# Patient Record
Sex: Male | Born: 1950 | Race: White | Hispanic: Yes | Marital: Single | Smoking: Never smoker
Health system: Southern US, Community
[De-identification: ages and names within clinical notes are randomized; demographics above are authoritative.]

## PROBLEM LIST (undated history)

## (undated) DIAGNOSIS — R7881 Bacteremia: Secondary | ICD-10-CM

## (undated) DIAGNOSIS — A09 Infectious gastroenteritis and colitis, unspecified: Secondary | ICD-10-CM

## (undated) DIAGNOSIS — K219 Gastro-esophageal reflux disease without esophagitis: Secondary | ICD-10-CM

## (undated) DIAGNOSIS — B9561 Methicillin susceptible Staphylococcus aureus infection as the cause of diseases classified elsewhere: Secondary | ICD-10-CM

## (undated) DIAGNOSIS — E119 Type 2 diabetes mellitus without complications: Secondary | ICD-10-CM

## (undated) DIAGNOSIS — K7460 Unspecified cirrhosis of liver: Secondary | ICD-10-CM

## (undated) DIAGNOSIS — I10 Essential (primary) hypertension: Secondary | ICD-10-CM

## (undated) HISTORY — DX: Methicillin susceptible Staphylococcus aureus infection as the cause of diseases classified elsewhere: B95.61

## (undated) HISTORY — PX: EYE SURGERY: SHX253

## (undated) HISTORY — DX: Bacteremia: R78.81

## (undated) HISTORY — PX: OTHER SURGICAL HISTORY: SHX169

## (undated) HISTORY — PX: SKIN GRAFT: SHX250

---

## 2013-09-18 ENCOUNTER — Inpatient Hospital Stay (HOSPITAL_COMMUNITY)
Admission: EM | Admit: 2013-09-18 | Discharge: 2013-09-24 | DRG: 871 | Disposition: A | Discharge status: Home Health | Payer: Medicare Other | Attending: Family Medicine | Admitting: Family Medicine

## 2013-09-18 ENCOUNTER — Emergency Department (HOSPITAL_COMMUNITY): Payer: Medicare Other

## 2013-09-18 ENCOUNTER — Encounter (HOSPITAL_COMMUNITY): Payer: Self-pay | Admitting: Emergency Medicine

## 2013-09-18 DIAGNOSIS — K7290 Hepatic failure, unspecified without coma: Secondary | ICD-10-CM

## 2013-09-18 DIAGNOSIS — A419 Sepsis, unspecified organism: Secondary | ICD-10-CM | POA: Diagnosis present

## 2013-09-18 DIAGNOSIS — K219 Gastro-esophageal reflux disease without esophagitis: Secondary | ICD-10-CM | POA: Diagnosis present

## 2013-09-18 DIAGNOSIS — E669 Obesity, unspecified: Secondary | ICD-10-CM | POA: Diagnosis present

## 2013-09-18 DIAGNOSIS — Z794 Long term (current) use of insulin: Secondary | ICD-10-CM

## 2013-09-18 DIAGNOSIS — R7881 Bacteremia: Secondary | ICD-10-CM

## 2013-09-18 DIAGNOSIS — K7682 Hepatic encephalopathy: Secondary | ICD-10-CM

## 2013-09-18 DIAGNOSIS — R5383 Other fatigue: Secondary | ICD-10-CM | POA: Diagnosis present

## 2013-09-18 DIAGNOSIS — A4101 Sepsis due to Methicillin susceptible Staphylococcus aureus: Principal | ICD-10-CM | POA: Diagnosis present

## 2013-09-18 DIAGNOSIS — F1020 Alcohol dependence, uncomplicated: Secondary | ICD-10-CM | POA: Diagnosis present

## 2013-09-18 DIAGNOSIS — E119 Type 2 diabetes mellitus without complications: Secondary | ICD-10-CM

## 2013-09-18 DIAGNOSIS — D61818 Other pancytopenia: Secondary | ICD-10-CM | POA: Diagnosis present

## 2013-09-18 DIAGNOSIS — B9561 Methicillin susceptible Staphylococcus aureus infection as the cause of diseases classified elsewhere: Secondary | ICD-10-CM | POA: Diagnosis present

## 2013-09-18 DIAGNOSIS — N390 Urinary tract infection, site not specified: Secondary | ICD-10-CM | POA: Diagnosis present

## 2013-09-18 DIAGNOSIS — K7460 Unspecified cirrhosis of liver: Secondary | ICD-10-CM | POA: Diagnosis present

## 2013-09-18 DIAGNOSIS — I8510 Secondary esophageal varices without bleeding: Secondary | ICD-10-CM | POA: Diagnosis present

## 2013-09-18 DIAGNOSIS — K7030 Alcoholic cirrhosis of liver without ascites: Secondary | ICD-10-CM | POA: Diagnosis present

## 2013-09-18 DIAGNOSIS — I10 Essential (primary) hypertension: Secondary | ICD-10-CM | POA: Diagnosis present

## 2013-09-18 DIAGNOSIS — B150 Hepatitis A with hepatic coma: Secondary | ICD-10-CM | POA: Diagnosis present

## 2013-09-18 DIAGNOSIS — I959 Hypotension, unspecified: Secondary | ICD-10-CM | POA: Diagnosis present

## 2013-09-18 DIAGNOSIS — K766 Portal hypertension: Secondary | ICD-10-CM | POA: Diagnosis present

## 2013-09-18 HISTORY — DX: Type 2 diabetes mellitus without complications: E11.9

## 2013-09-18 HISTORY — DX: Gastro-esophageal reflux disease without esophagitis: K21.9

## 2013-09-18 HISTORY — DX: Infectious gastroenteritis and colitis, unspecified: A09

## 2013-09-18 HISTORY — DX: Essential (primary) hypertension: I10

## 2013-09-18 LAB — GLUCOSE, CAPILLARY: Glucose-Capillary: 151 mg/dL — ABNORMAL HIGH (ref 70–99)

## 2013-09-18 MED ORDER — ACETAMINOPHEN 650 MG RE SUPP
RECTAL | Status: AC
  Filled 2013-09-18: qty 1

## 2013-09-18 MED ORDER — ACETAMINOPHEN 650 MG RE SUPP
650.0000 mg | Freq: Once | RECTAL | Status: AC
  Administered 2013-09-18: 650 mg via RECTAL

## 2013-09-18 NOTE — ED Notes (Signed)
Nausea, vomiting, altered LOC, fever, high blood sugar, and high blood pressure per EMS. Started around 1900 tonight per EMS.

## 2013-09-18 NOTE — ED Notes (Signed)
Pt vomiting yellow bile. Pt unable to tell what is wrong.

## 2013-09-18 NOTE — ED Provider Notes (Addendum)
CSN: [id number]     Arrival date & time 09/18/13  2302 History   First MD Initiated Contact with Patient 09/18/13 2337     This chart was scribed for Vida Roller, MD by Manuela Schwartz, ED scribe. This patient was seen in room APA08/APA08 and the patient's care was started at 2337.  Chief Complaint  Patient presents with  . Altered Mental Status   Level 5 Caveat to pt's AMS  The history is provided by the patient. No language interpreter was used.   HPI Comments: Kinston Magnan is a 62 y.o. male brought in by ambulance from home, who presents to the Emergency Department for AMS. EMS reports of nausea, emesis episodes, high blood sugar (159) and elevated blood pressure. Reports that his sx began 3 hours PTA.   Meds with him brought by EMS: propanalol and xifaxan   Past Medical History  Diagnosis Date  . Diabetes mellitus without complication   . Hypertension   . GERD (gastroesophageal reflux disease)   . Diarrhea, travelers'    History reviewed. No pertinent past surgical history. No family history on file. History  Substance Use Topics  . Smoking status: Not on file  . Smokeless tobacco: Not on file  . Alcohol Use: No    Review of Systems  Unable to perform ROS  A complete 10 system review of systems was obtained and all systems are negative except as noted in the HPI and PMH.   Allergies  Review of patient's allergies indicates no known allergies.  Home Medications   Current Outpatient Rx  Name  Route  Sig  Dispense  Refill  . insulin NPH-regular (NOVOLIN 70/30) (70-30) 100 UNIT/ML injection   Subcutaneous   Inject 75 Units into the skin daily with breakfast.         . insulin NPH-regular (NOVOLIN 70/30) (70-30) 100 UNIT/ML injection   Subcutaneous   Inject 40 Units into the skin daily with supper.         . losartan (COZAAR) 100 MG tablet   Oral   Take 100 mg by mouth daily.         Marland Kitchen omeprazole (PRILOSEC) 20 MG capsule   Oral   Take 20 mg by mouth  daily.         . propranolol (INDERAL) 20 MG tablet   Oral   Take 20 mg by mouth daily.         . rifaximin (XIFAXAN) 550 MG TABS tablet   Oral   Take 550 mg by mouth 2 (two) times daily.         . tamsulosin (FLOMAX) 0.4 MG CAPS capsule   Oral   Take 0.4 mg by mouth daily.         Marland Kitchen zinc gluconate 50 MG tablet   Oral   Take 50 mg by mouth daily.          Triage Vitals: BP 146/59  Pulse 90  Temp(Src) 102.5 F (39.2 C) (Oral)  Resp 20  SpO2 95% Physical Exam  Nursing note and vitals reviewed. Constitutional: He is oriented to person, place, and time. He appears well-developed and well-nourished. No distress.  Obese  HENT:  Head: Normocephalic and atraumatic.  Eyes: Conjunctivae are normal. Right eye exhibits no discharge. Left eye exhibits no discharge.  Neck: Normal range of motion.  Cardiovascular: Regular rhythm and normal heart sounds.   No murmur heard. Tachycardic 105 Capillar refill less 2 secodns at toes   Pulmonary/Chest:  Effort normal and breath sounds normal. No respiratory distress. He has no wheezes. He has no rales.  Tachypneic 30 respirations/minute  Abdominal: Soft. He exhibits no mass. There is no tenderness. There is no guarding.  Musculoskeletal: Normal range of motion. He exhibits no edema.  LLE prior skin grafting, well healed   Neurological: He is alert and oriented to person, place, and time.  No response to questions. Withdraws to pain but does not follow commands. He is somnulent arousable to painful stimuli     Skin: Skin is warm and dry.  Psychiatric: He has a normal mood and affect. Thought content normal.    ED Course  Procedures (including critical care time) DIAGNOSTIC STUDIES: Oxygen Saturation is 95% on room air, adequate by my interpretation.    COORDINATION OF CARE: At 1140 Discussed treatment plan with patient which includes tylenol level, EKG, CBG, blood work, head CT, UA, cardiac enzymes, CXR. Patient agrees.    1140 PM - O2 saturation 94% RA, pulse 100. BP 131/56  Labs Review Labs Reviewed  COMPREHENSIVE METABOLIC PANEL - Abnormal; Notable for the following:    Glucose, Bld 164 (*)    Calcium 8.3 (*)    Albumin 2.8 (*)    Alkaline Phosphatase 163 (*)    Total Bilirubin 2.0 (*)    GFR calc non Af Amer 77 (*)    GFR calc Af Amer 89 (*)    All other components within normal limits  CBC WITH DIFFERENTIAL - Abnormal; Notable for the following:    HCT 38.7 (*)    Platelets 31 (*)    Neutrophils Relative % 87 (*)    Lymphocytes Relative 6 (*)    Lymphs Abs 0.3 (*)    All other components within normal limits  GLUCOSE, CAPILLARY - Abnormal; Notable for the following:    Glucose-Capillary 151 (*)    All other components within normal limits  URINALYSIS, ROUTINE W REFLEX MICROSCOPIC - Abnormal; Notable for the following:    Glucose, UA >1000 (*)    Hgb urine dipstick LARGE (*)    Protein, ur 100 (*)    All other components within normal limits  URINE MICROSCOPIC-ADD ON - Abnormal; Notable for the following:    Bacteria, UA MANY (*)    All other components within normal limits  BLOOD GAS, ARTERIAL - Abnormal; Notable for the following:    pH, Arterial 7.485 (*)    pCO2 arterial 27.9 (*)    pO2, Arterial 108.0 (*)    Acid-base deficit 2.2 (*)    All other components within normal limits  CULTURE, BLOOD (ROUTINE X 2)  CULTURE, BLOOD (ROUTINE X 2)  URINE CULTURE  LACTIC ACID, PLASMA  TROPONIN I  INFLUENZA PANEL BY PCR  GLUCOSE, CAPILLARY  URINE RAPID DRUG SCREEN (HOSP PERFORMED)   Imaging Review Ct Head Wo Contrast  09/19/2013   CLINICAL DATA:  Hypertension, diabetes, altered mental status. Hyperglycemic and elevated blood pressure.  EXAM: CT HEAD WITHOUT CONTRAST  TECHNIQUE: Contiguous axial images were obtained from the base of the skull through the vertex without intravenous contrast.  COMPARISON:  None available for comparison at time of study interpretation.  FINDINGS: The  ventricles and sulci are normal for age. No intraparenchymal hemorrhage, mass effect nor midline shift. Minimal patchy supratentorial white matter to hypodensities may reflect chronic small vessel ischemic disease. No acute large vascular territory infarcts.  No abnormal extra-axial fluid collections. Basal cisterns are patent. Mild calcific atherosclerosis of the carotid siphons.  No skull fracture. Paranasal sinus mucosal thickening without air-fluid levels. Mastoid air cells appear well-aerated. Moderate right temporomandibular osteoarthrosis. The included ocular globes and orbital contents are non-suspicious. Symmetric medial deviation of the lamina papyracea suggests normal variant.  IMPRESSION: No acute intracranial process.  Mild paranasal sinusitis.   Electronically Signed   By: Awilda Metro   On: 09/19/2013 01:36   Dg Chest Port 1 View  09/19/2013   CLINICAL DATA:  Shortness of breath and chest pain.  EXAM: PORTABLE CHEST - 1 VIEW  COMPARISON:  None.  FINDINGS: The lungs are hypoexpanded. Vascular congestion is noted, with question of minimal interstitial edema. There is no evidence of pleural effusion or pneumothorax.  The cardiomediastinal silhouette is borderline enlarged. No acute osseous abnormalities are seen.  IMPRESSION: Lungs hypoexpanded. Vascular congestion and borderline cardiomegaly, with question of minimal interstitial edema.   Electronically Signed   By: Roanna Raider M.D.   On: 09/19/2013 00:16    EKG Interpretation    Date/Time:  Thursday September 18 2013 23:10:38 EST Ventricular Rate:  95 PR Interval:  174 QRS Duration: 86 QT Interval:  336 QTC Calculation: 422 R Axis:   12 Text Interpretation:  Normal sinus rhythm Cannot rule out Inferior infarct , age undetermined Abnormal ECG No previous ECGs available Confirmed by Levante Simones  MD, Ahuva Poynor ([id number]) on 09/18/2013 11:38:40 PM            MDM   1. Sepsis   2. UTI (lower urinary tract infection)    The pt was  found to be severely hyperthermic > 104, was given rectal tylenol, IV toradol and fluids - he had improvement of his MS with this and is now talking a little bit.  He tolerated some oral fluids.  CT neg of the head, CXR neg but UA shows UTI - his lactic acid is < 4 but > 2 and with his tachycardia and fever, he is likely sepsis.     Multiple rechecks of mental status and neuro status showed steady improvement, d/w Dr. Orvan Falconer who will admit to hospital.  Over the course of the last hour that the patient was in the emergency department, his blood pressure dropped to below 80 systolic. He was resuscitated with IV fluids and had transient improvement which became a more permanent improvement after 3 L of IV fluids. He was not hypertensive on his transition to the admitted hospital bed. Fever has defervesced  CRITICAL CARE Performed by: Vida Roller Total critical care time: 35 Critical care time was exclusive of separately billable procedures and treating other patients. Critical care was necessary to treat or prevent imminent or life-threatening deterioration. Critical care was time spent personally by me on the following activities: development of treatment plan with patient and/or surrogate as well as nursing, discussions with consultants, evaluation of patient's response to treatment, examination of patient, obtaining history from patient or surrogate, ordering and performing treatments and interventions, ordering and review of laboratory studies, ordering and review of radiographic studies, pulse oximetry and re-evaluation of patient's condition.   Meds given in ED:  Medications  acetaminophen (TYLENOL) suppository 650 mg (650 mg Rectal Given 09/18/13 2357)  ketorolac (TORADOL) 30 MG/ML injection 30 mg (30 mg Intravenous Given 09/19/13 0056)  cefTRIAXone (ROCEPHIN) 1 g in dextrose 5 % 50 mL IVPB (0 g Intravenous Stopped 09/19/13 0220)  sodium chloride 0.9 % bolus 1,000 mL (0 mLs Intravenous  Stopped 09/19/13 0250)     I personally performed the services described in this  documentation, which was scribed in my presence. The recorded information has been reviewed and is accurate.      Vida Roller, MD 09/19/13 1610  Vida Roller, MD 09/19/13 [phone]

## 2013-09-19 ENCOUNTER — Inpatient Hospital Stay (HOSPITAL_COMMUNITY): Payer: Medicare Other

## 2013-09-19 ENCOUNTER — Encounter (HOSPITAL_COMMUNITY): Payer: Self-pay | Admitting: Internal Medicine

## 2013-09-19 ENCOUNTER — Emergency Department (HOSPITAL_COMMUNITY): Payer: Medicare Other

## 2013-09-19 DIAGNOSIS — I959 Hypotension, unspecified: Secondary | ICD-10-CM

## 2013-09-19 DIAGNOSIS — A419 Sepsis, unspecified organism: Secondary | ICD-10-CM | POA: Diagnosis present

## 2013-09-19 DIAGNOSIS — R5383 Other fatigue: Secondary | ICD-10-CM | POA: Diagnosis present

## 2013-09-19 DIAGNOSIS — D61818 Other pancytopenia: Secondary | ICD-10-CM | POA: Diagnosis present

## 2013-09-19 DIAGNOSIS — N390 Urinary tract infection, site not specified: Secondary | ICD-10-CM | POA: Diagnosis present

## 2013-09-19 DIAGNOSIS — R5381 Other malaise: Secondary | ICD-10-CM

## 2013-09-19 DIAGNOSIS — I10 Essential (primary) hypertension: Secondary | ICD-10-CM | POA: Diagnosis present

## 2013-09-19 DIAGNOSIS — E119 Type 2 diabetes mellitus without complications: Secondary | ICD-10-CM

## 2013-09-19 LAB — CBC WITH DIFFERENTIAL/PLATELET
Basophils Absolute: 0 10*3/uL (ref 0.0–0.1)
Basophils Relative: 0 % (ref 0–1)
Eosinophils Absolute: 0.1 10*3/uL (ref 0.0–0.7)
Eosinophils Relative: 3 % (ref 0–5)
HCT: 38.7 % — ABNORMAL LOW (ref 39.0–52.0)
Hemoglobin: 13.7 g/dL (ref 13.0–17.0)
Lymphocytes Relative: 6 % — ABNORMAL LOW (ref 12–46)
MCH: 31.6 pg (ref 26.0–34.0)
MCHC: 35.4 g/dL (ref 30.0–36.0)
Monocytes Absolute: 0.2 10*3/uL (ref 0.1–1.0)
Monocytes Relative: 4 % (ref 3–12)
Neutro Abs: 4.2 10*3/uL (ref 1.7–7.7)
Neutrophils Relative %: 87 % — ABNORMAL HIGH (ref 43–77)
Platelets: 31 10*3/uL — ABNORMAL LOW (ref 150–400)
WBC: 4.8 10*3/uL (ref 4.0–10.5)

## 2013-09-19 LAB — GLUCOSE, CAPILLARY
Glucose-Capillary: 114 mg/dL — ABNORMAL HIGH (ref 70–99)
Glucose-Capillary: 177 mg/dL — ABNORMAL HIGH (ref 70–99)
Glucose-Capillary: 194 mg/dL — ABNORMAL HIGH (ref 70–99)
Glucose-Capillary: 214 mg/dL — ABNORMAL HIGH (ref 70–99)
Glucose-Capillary: 71 mg/dL (ref 70–99)
Glucose-Capillary: 86 mg/dL (ref 70–99)

## 2013-09-19 LAB — CBC
Platelets: 25 10*3/uL — CL (ref 150–400)
RBC: 4.07 MIL/uL — ABNORMAL LOW (ref 4.22–5.81)
RDW: 14.6 % (ref 11.5–15.5)
WBC: 5.1 10*3/uL (ref 4.0–10.5)

## 2013-09-19 LAB — URINALYSIS, ROUTINE W REFLEX MICROSCOPIC
Bilirubin Urine: NEGATIVE
Glucose, UA: >1000 mg/dL — AB
Nitrite: NEGATIVE
Protein, ur: 100 mg/dL — AB
Specific Gravity, Urine: 1.025 (ref 1.005–1.030)
Urobilinogen, UA: 0.2 mg/dL (ref 0.0–1.0)

## 2013-09-19 LAB — COMPREHENSIVE METABOLIC PANEL
AST: 34 U/L (ref 0–37)
BUN: 15 mg/dL (ref 6–23)
CO2: 22 mEq/L (ref 19–32)
Calcium: 8.3 mg/dL — ABNORMAL LOW (ref 8.4–10.5)
Creatinine, Ser: 1.03 mg/dL (ref 0.50–1.35)
GFR calc Af Amer: 89 mL/min — ABNORMAL LOW (ref >90)
GFR calc non Af Amer: 77 mL/min — ABNORMAL LOW (ref >90)
Glucose, Bld: 164 mg/dL — ABNORMAL HIGH (ref 70–99)
Total Protein: 6.2 g/dL (ref 6.0–8.3)

## 2013-09-19 LAB — HEMOGLOBIN A1C
Hgb A1c MFr Bld: 8.3 % — ABNORMAL HIGH (ref <5.7)
Mean Plasma Glucose: 192 mg/dL — ABNORMAL HIGH (ref <117)

## 2013-09-19 LAB — BLOOD GAS, ARTERIAL
Acid-base deficit: 2.2 mmol/L — ABNORMAL HIGH (ref 0.0–2.0)
Drawn by: 317771
O2 Content: 2 L/min
O2 Saturation: 98.7 %
TCO2: 18.3 mmol/L (ref 0–100)
pO2, Arterial: 108 mmHg — ABNORMAL HIGH (ref 80.0–100.0)

## 2013-09-19 LAB — RAPID URINE DRUG SCREEN, HOSP PERFORMED
Amphetamines: NOT DETECTED
Benzodiazepines: NOT DETECTED
Cocaine: NOT DETECTED
Opiates: NOT DETECTED
Tetrahydrocannabinol: NOT DETECTED

## 2013-09-19 LAB — BASIC METABOLIC PANEL
BUN: 18 mg/dL (ref 6–23)
Chloride: 109 mEq/L (ref 96–112)
Creatinine, Ser: 1.22 mg/dL (ref 0.50–1.35)
GFR calc Af Amer: 73 mL/min — ABNORMAL LOW (ref >90)
GFR calc non Af Amer: 63 mL/min — ABNORMAL LOW (ref >90)
Potassium: 3.4 mEq/L — ABNORMAL LOW (ref 3.5–5.1)

## 2013-09-19 LAB — CREATININE, SERUM
Creatinine, Ser: 1.21 mg/dL (ref 0.50–1.35)
GFR calc Af Amer: 73 mL/min — ABNORMAL LOW (ref >90)
GFR calc non Af Amer: 63 mL/min — ABNORMAL LOW (ref >90)

## 2013-09-19 LAB — LACTIC ACID, PLASMA: Lactic Acid, Venous: 2.2 mmol/L (ref 0.5–2.2)

## 2013-09-19 LAB — TSH: TSH: 1.807 u[IU]/mL (ref 0.350–4.500)

## 2013-09-19 LAB — PROTIME-INR
INR: 1.57 — ABNORMAL HIGH (ref 0.00–1.49)
Prothrombin Time: 18.3 seconds — ABNORMAL HIGH (ref 11.6–15.2)

## 2013-09-19 LAB — TROPONIN I: Troponin I: <0.3 ng/mL (ref <0.30)

## 2013-09-19 LAB — MRSA PCR SCREENING: MRSA by PCR: NEGATIVE

## 2013-09-19 MED ORDER — ACETAMINOPHEN 325 MG PO TABS
650.0000 mg | ORAL_TABLET | Freq: Four times a day (QID) | ORAL | Status: DC | PRN
  Administered 2013-09-19 – 2013-09-20 (×3): 650 mg via ORAL
  Filled 2013-09-19 (×3): qty 2

## 2013-09-19 MED ORDER — RIFAXIMIN 550 MG PO TABS
550.0000 mg | ORAL_TABLET | Freq: Two times a day (BID) | ORAL | Status: DC
  Administered 2013-09-19 – 2013-09-24 (×11): 550 mg via ORAL
  Filled 2013-09-19 (×16): qty 1

## 2013-09-19 MED ORDER — KETOROLAC TROMETHAMINE 30 MG/ML IJ SOLN
30.0000 mg | Freq: Once | INTRAMUSCULAR | Status: AC
  Administered 2013-09-19: 30 mg via INTRAVENOUS
  Filled 2013-09-19: qty 1

## 2013-09-19 MED ORDER — DEXTROSE 5 % IV SOLN
1.0000 g | Freq: Once | INTRAVENOUS | Status: AC
  Administered 2013-09-19: 1 g via INTRAVENOUS
  Filled 2013-09-19: qty 10

## 2013-09-19 MED ORDER — POTASSIUM CHLORIDE IN NACL 20-0.9 MEQ/L-% IV SOLN
INTRAVENOUS | Status: DC
  Administered 2013-09-19 – 2013-09-20 (×2): via INTRAVENOUS

## 2013-09-19 MED ORDER — DEXTROSE 5 % IV SOLN
1.0000 g | Freq: Every day | INTRAVENOUS | Status: DC
  Administered 2013-09-19: 1 g via INTRAVENOUS
  Filled 2013-09-19 (×3): qty 10

## 2013-09-19 MED ORDER — SODIUM CHLORIDE 0.9 % IV BOLUS (SEPSIS)
1000.0000 mL | Freq: Once | INTRAVENOUS | Status: AC
  Administered 2013-09-19: 1000 mL via INTRAVENOUS

## 2013-09-19 MED ORDER — LACTULOSE 10 GM/15ML PO SOLN
20.0000 g | Freq: Three times a day (TID) | ORAL | Status: DC
  Administered 2013-09-19 – 2013-09-20 (×4): 20 g via ORAL
  Filled 2013-09-19 (×4): qty 30

## 2013-09-19 MED ORDER — ONDANSETRON HCL 4 MG/2ML IJ SOLN: 4.0000 mg | INTRAMUSCULAR | Status: DC | PRN

## 2013-09-19 MED ORDER — IOHEXOL 300 MG/ML  SOLN
100.0000 mL | Freq: Once | INTRAMUSCULAR | Status: AC | PRN
  Administered 2013-09-19: 100 mL via INTRAVENOUS

## 2013-09-19 MED ORDER — SODIUM CHLORIDE 0.9 % IV SOLN
1000.0000 mL | Freq: Once | INTRAVENOUS | Status: AC
  Administered 2013-09-19: 1000 mL via INTRAVENOUS

## 2013-09-19 MED ORDER — INSULIN ASPART 100 UNIT/ML ~~LOC~~ SOLN
0.0000 [IU] | SUBCUTANEOUS | Status: DC
  Administered 2013-09-19 (×2): 2 [IU] via SUBCUTANEOUS
  Administered 2013-09-19 – 2013-09-20 (×2): 3 [IU] via SUBCUTANEOUS

## 2013-09-19 MED ORDER — SODIUM CHLORIDE 0.9 % IJ SOLN
3.0000 mL | Freq: Two times a day (BID) | INTRAMUSCULAR | Status: DC
  Administered 2013-09-19 – 2013-09-24 (×4): 3 mL via INTRAVENOUS

## 2013-09-19 MED ORDER — TAMSULOSIN HCL 0.4 MG PO CAPS
0.4000 mg | ORAL_CAPSULE | Freq: Every day | ORAL | Status: DC
  Administered 2013-09-19 – 2013-09-23 (×5): 0.4 mg via ORAL
  Filled 2013-09-19 (×5): qty 1

## 2013-09-19 MED ORDER — IOHEXOL 300 MG/ML  SOLN
50.0000 mL | Freq: Once | INTRAMUSCULAR | Status: AC | PRN
  Administered 2013-09-19: 50 mL via ORAL

## 2013-09-19 MED ORDER — HEPARIN SODIUM (PORCINE) 5000 UNIT/ML IJ SOLN
5000.0000 [IU] | Freq: Three times a day (TID) | INTRAMUSCULAR | Status: DC
Start: 2013-09-19 — End: 2013-09-19
  Administered 2013-09-19: 5000 [IU] via SUBCUTANEOUS
  Filled 2013-09-19: qty 1

## 2013-09-19 MED ORDER — ASPIRIN EC 81 MG PO TBEC
81.0000 mg | DELAYED_RELEASE_TABLET | Freq: Every day | ORAL | Status: DC
  Administered 2013-09-19: 81 mg via ORAL
  Filled 2013-09-19: qty 1

## 2013-09-19 NOTE — Progress Notes (Signed)
UR chart review completed.  

## 2013-09-19 NOTE — Progress Notes (Signed)
Report called to ICU nurse, pt transferred via bed to ICU room 8.

## 2013-09-19 NOTE — Evaluation (Signed)
Physical Therapy Evaluation Patient Details Name: Danthony Kendrix MRN: [id number] DOB: 1951-09-24 Today's Date: 09/19/2013    PT Assessment / Plan / Recommendation History of Present Illness  Berkley Cronkright is a 62 y.o. male.   Obese [place] gentleman brought in by ambulance lethargic and febrile. He was initially also hypotensive but received vigorous IV fluid resuscitation in the emergency room.  Marland Kitchen He reports  feeling bad but denies pain.  He continously moans and has his eyes closed.   Pt is not ready to participate in therapy at this time.                            Precautions / Restrictions Precautions Precautions: None Restrictions Weight Bearing Restrictions: No      PT Goals(Current goals can be found in the care plan section)    Visit Information  Reason Eval/Treat Not Completed: Medical issues which prohibited therapy History of Present Illness: Yichen Gilardi is a 62 y.o. male.   Obese [place] gentleman brought in by ambulance lethargic and febrile. He was initially also hypotensive but received vigorous IV fluid resuscitation in the emergency room.  Marland Kitchen He reports  feeling bad but denies pain.  He continously moans and has his eyes closed.   Pt is not ready to participate in therapy at this time.         Prior Functioning  Home Living Family/patient expects to be discharged to:: Private residence Living Arrangements: Children Prior Function Level of Independence: Independent Comments: Pt states normally ambulates without an assistive device. Communication Communication: Prefers language other than English Dominant Hand: Right    Cognition  Cognition Arousal/Alertness: Lethargic Overall Cognitive Status: Within Functional Limits for tasks assessed             GP     RUSSELL,CINDY 09/19/2013, 9:05 AM

## 2013-09-19 NOTE — ED Notes (Signed)
EDP notified of core temp & ice packs applied to groin & neck

## 2013-09-19 NOTE — Progress Notes (Signed)
Patient admitted by Dr. Orvan Falconer earlier this morning.   Patient seen and examined.  Patient was admitted with altered mental status, fever and hypotension. He was found to have a urinalysis indicative of infection. He was started on aggressive iv hydration and blood pressures improved.  He was started on antibiotics and admitted to the medical floor.  At the time of my evaluation, patient is diaphoretic and appears to have increased work of breathing.  He is complaining of abdominal pain and headache.  He feels that his abdomen feels full.  There is an emesis basin next to him.  Reports last stool was yesterday morning.  Abdomen feels distended, soft and mildly tender diffusely.  Review of lab work indicates a significant thrombocytopenia with platelet count of 25, and elevated INR at 1.5 and ammonia in the 80s.  Patient is on xifaxan at home.  There is no reported alcohol abuse.  He likely has some underlying chronic liver disease.  Unfortunately, we do not have any baseline labs for comparison since the majority of his care is received in [place]. He is also followed by a gastroenterologist Dr. Allena Katz In [place]. Unfortunately all offices are closed until Monday, so we will not be able to obtain any records until then.  Patient appears to be septic at this time.  I am concerned that there may be more than just a urinary tract infection. I think he would benefit from monitoring in the step down unit for now.  Continue rocephin and follow up cultures.  Will also check abdominal CT to rule out other pathologies. He does not have any evidence of pneumonia on chest xray, but this will also be repeated since he is better hydrated now. Continue xifaxan and lactulose for hepatic encephalopathy.  Influenza panel was found to be negative.  No family present in room and no contact info in chart.  Will update family when they are available.  Everly Rubalcava

## 2013-09-19 NOTE — ED Notes (Signed)
Pt more responsive, pt asking for some water.

## 2013-09-19 NOTE — Progress Notes (Signed)
Physical Therapy Discharge Patient Details Name: Caleb Riley MRN: [id number] DOB: 1950/11/04 Today's Date: 09/19/2013 Time:  -     Patient discharged from PT services secondary to medical decline - will need to re-order PT to resume therapy services.  Please see latest therapy progress note for current level of functioning and progress toward goals.    Progress and discharge plan discussed with patient and/or caregiver: Patient/Caregiver agrees with plan  GP     [name],CINDY 09/19/2013, 2:12 PM

## 2013-09-19 NOTE — H&P (Signed)
[facility] History and Physical  Caleb Riley  AVW:[id number]  DOB: 25-Jun-1951   DOA: 09/19/2013   PCP:   No primary provider on file.   Chief Complaint:  Altered mental status  HPI: Caleb Riley is a 62 y.o. male.   Obese [place] gentleman brought in by ambulance lethargic and febrile. He was initially also hypotensive but received vigorous IV fluid resuscitation in the emergency room and blood pressures normalized and his mentation is much improved. He reports he has been feeling sick for a couple of days including abdominal pain but falls asleep often during the interview.  He denies frequency or dysuria. Denies headache nausea or vomiting; denies chest pain or cough.  His diabetes is managed by Dr. Renaee Munda in [place]. He admits to falling asleep frequently during the day.  his medications include tamsulosin and rifaximin      Rewiew of Systems:  Unable to obtain further because of difficulty remaining awake    Past Medical History  Diagnosis Date  . Diabetes mellitus without complication   . Hypertension   . GERD (gastroesophageal reflux disease)   . Diarrhea, travelers'     History reviewed. No pertinent past surgical history.  Medications:  HOME MEDS: Prior to Admission medications   Medication Sig Start Date End Date Taking? Authorizing Provider  insulin NPH-regular (NOVOLIN 70/30) (70-30) 100 UNIT/ML injection Inject 75 Units into the skin daily with breakfast.   Yes Historical Provider, MD  insulin NPH-regular (NOVOLIN 70/30) (70-30) 100 UNIT/ML injection Inject 40 Units into the skin daily with supper.   Yes Historical Provider, MD  losartan (COZAAR) 100 MG tablet Take 100 mg by mouth daily.   Yes Historical Provider, MD  omeprazole (PRILOSEC) 20 MG capsule Take 20 mg by mouth daily.   Yes Historical Provider, MD  propranolol (INDERAL) 20 MG tablet Take 20 mg by mouth daily.   Yes Historical Provider, MD  rifaximin (XIFAXAN) 550 MG TABS tablet Take 550  mg by mouth 2 (two) times daily.   Yes Historical Provider, MD  tamsulosin (FLOMAX) 0.4 MG CAPS capsule Take 0.4 mg by mouth daily.   Yes Historical Provider, MD  zinc gluconate 50 MG tablet Take 50 mg by mouth daily.   Yes Historical Provider, MD     Allergies:  No Known Allergies  Social History:   reports that he does not drink alcohol. His tobacco and drug histories are not on file.  Family History: No family history on file.   Physical Exam: Filed Vitals:   09/19/13 0100 09/19/13 0146 09/19/13 0223 09/19/13 0250  BP: 112/51 102/53 126/40 75/25  Pulse: 88 93 78 77  Temp:  102.9 F (39.4 C) 102 F (38.9 C) 101.3 F (38.5 C)  TempSrc:  Core (Comment) Core (Comment) Core (Comment)  Resp: 33 24 24 24   SpO2:  98% 96%    Blood pressure 11658, pulse 77, temperature 101.3 F (38.5 C), temperature source Core (Comment), resp. rate 24, SpO2 96.00%. There is no height or weight on file to calculate BMI.   GEN: Lethargic obese middle-aged Hispanic gentleman  lying bed noisy nasal breathing; cooperative with exam when I will; no neck stiffness PSYCH: Left sick but oriented when alert;  neither anxious nor depressed; affect is appropriate. HEENT: Mucous membranes pink dry and anicteric; PERRLA; EOM intact;  thick neck;  neck supple   Breasts:: Not examined CHEST WALL: No tenderness CHEST: Normal respiration, clear to auscultation bilaterally HEART: Regular rate and rhythm; no murmurs rubs or  gallops ABDOMEN: Obese, soft non-tender; no masses, no organomegaly, normal abdominal bowel sounds; no pannus; no intertriginous candida. Rectal Exam: Not done EXTREMITIES: Deformed left leg with large healed scar on shin; bilateral diabetic dermopathy; no edema Genitalia: not examined PULSES: 2+ and symmetric CNS: Cranial nerves 2-12 grossly intact no focal lateralizing neurologic deficit   Labs on Admission:  Basic Metabolic Panel:  Recent Labs Lab 09/18/13 2322  NA 141  K 3.7  CL  107  CO2 22  GLUCOSE 164*  BUN 15  CREATININE 1.03  CALCIUM 8.3*   Liver Function Tests:  Recent Labs Lab 09/18/13 2322  AST 34  ALT 35  ALKPHOS 163*  BILITOT 2.0*  PROT 6.2  ALBUMIN 2.8*   No results found for this basename: LIPASE, AMYLASE,  in the last 168 hours No results found for this basename: AMMONIA,  in the last 168 hours CBC:  Recent Labs Lab 09/18/13 2322  WBC 4.8  NEUTROABS 4.2  HGB 13.7  HCT 38.7*  MCV 89.4  PLT 31*   Cardiac Enzymes:  Recent Labs Lab 09/18/13 2350  TROPONINI <0.30   BNP: No components found with this basename: POCBNP,  D-dimer: No components found with this basename: D-DIMER,  CBG:  Recent Labs Lab 09/18/13 2326 09/19/13 0153  GLUCAP 151* 86    Radiological Exams on Admission: Ct Head Wo Contrast  09/19/2013   CLINICAL DATA:  Hypertension, diabetes, altered mental status. Hyperglycemic and elevated blood pressure.  EXAM: CT HEAD WITHOUT CONTRAST  TECHNIQUE: Contiguous axial images were obtained from the base of the skull through the vertex without intravenous contrast.  COMPARISON:  None available for comparison at time of study interpretation.  FINDINGS: The ventricles and sulci are normal for age. No intraparenchymal hemorrhage, mass effect nor midline shift. Minimal patchy supratentorial white matter to hypodensities may reflect chronic small vessel ischemic disease. No acute large vascular territory infarcts.  No abnormal extra-axial fluid collections. Basal cisterns are patent. Mild calcific atherosclerosis of the carotid siphons.  No skull fracture. Paranasal sinus mucosal thickening without air-fluid levels. Mastoid air cells appear well-aerated. Moderate right temporomandibular osteoarthrosis. The included ocular globes and orbital contents are non-suspicious. Symmetric medial deviation of the lamina papyracea suggests normal variant.  IMPRESSION: No acute intracranial process.  Mild paranasal sinusitis.   Electronically  Signed   By: Awilda Metro   On: 09/19/2013 01:36   Dg Chest Port 1 View  09/19/2013   CLINICAL DATA:  Shortness of breath and chest pain.  EXAM: PORTABLE CHEST - 1 VIEW  COMPARISON:  None.  FINDINGS: The lungs are hypoexpanded. Vascular congestion is noted, with question of minimal interstitial edema. There is no evidence of pleural effusion or pneumothorax.  The cardiomediastinal silhouette is borderline enlarged. No acute osseous abnormalities are seen.  IMPRESSION: Lungs hypoexpanded. Vascular congestion and borderline cardiomegaly, with question of minimal interstitial edema.   Electronically Signed   By: Roanna Raider M.D.   On: 09/19/2013 00:16      Assessment/Plan   Active Problems:   Sepsis   UTI (lower urinary tract infection)   Lethargy   Diabetes mellitus, type 2   Essential hypertension, benign   Hypotension, unspecified    PLAN:  vigorous IV fluid hydration; Ceftriaxone, presumed urinary tract the cause of his sepsis Sliding scale insulin while n.p.o. Review his status in a few hours; on antibiotics if not significantly improved    Other plans as per orders.  Code Status: full code   Kiasia Chou Nocturnist  [facility] Pager [phone]   09/19/2013, 3:35 AM

## 2013-09-19 NOTE — Progress Notes (Addendum)
CRITICAL VALUE ALERT  Critical value received: PLATELETS 25  Date of notification:  09/19/2013 Time of notification:  0900  Critical value read back: YES  Nurse who received alert: Alisia Ferrari ,RN MD notified (1st page):  [phone]  Time of first page:  0950 Dr. Kerry Hough  MD notified (2nd page):  Time of second page:  Responding MD: [email or web address]  Time MD responded:  [phone]

## 2013-09-20 ENCOUNTER — Encounter (HOSPITAL_COMMUNITY): Payer: Self-pay

## 2013-09-20 DIAGNOSIS — R7881 Bacteremia: Secondary | ICD-10-CM | POA: Diagnosis present

## 2013-09-20 DIAGNOSIS — K7290 Hepatic failure, unspecified without coma: Secondary | ICD-10-CM

## 2013-09-20 DIAGNOSIS — K7460 Unspecified cirrhosis of liver: Secondary | ICD-10-CM

## 2013-09-20 LAB — URINE CULTURE: Colony Count: >=100000

## 2013-09-20 LAB — COMPREHENSIVE METABOLIC PANEL
ALT: 27 U/L (ref 0–53)
AST: 31 U/L (ref 0–37)
Albumin: 2.1 g/dL — ABNORMAL LOW (ref 3.5–5.2)
Alkaline Phosphatase: 61 U/L (ref 39–117)
Calcium: 7 mg/dL — ABNORMAL LOW (ref 8.4–10.5)
GFR calc Af Amer: 80 mL/min — ABNORMAL LOW (ref >90)
Glucose, Bld: 184 mg/dL — ABNORMAL HIGH (ref 70–99)
Potassium: 3.8 mEq/L (ref 3.5–5.1)
Sodium: 139 mEq/L (ref 135–145)
Total Bilirubin: 1.9 mg/dL — ABNORMAL HIGH (ref 0.3–1.2)
Total Protein: 5 g/dL — ABNORMAL LOW (ref 6.0–8.3)

## 2013-09-20 LAB — GLUCOSE, CAPILLARY
Glucose-Capillary: 172 mg/dL — ABNORMAL HIGH (ref 70–99)
Glucose-Capillary: 247 mg/dL — ABNORMAL HIGH (ref 70–99)
Glucose-Capillary: 292 mg/dL — ABNORMAL HIGH (ref 70–99)

## 2013-09-20 LAB — CBC
Hemoglobin: 11.1 g/dL — ABNORMAL LOW (ref 13.0–17.0)
MCH: 31.9 pg (ref 26.0–34.0)
MCHC: 34.8 g/dL (ref 30.0–36.0)
Platelets: 23 10*3/uL — CL (ref 150–400)
RBC: 3.48 MIL/uL — ABNORMAL LOW (ref 4.22–5.81)

## 2013-09-20 MED ORDER — VANCOMYCIN HCL IN DEXTROSE 1-5 GM/200ML-% IV SOLN
INTRAVENOUS | Status: AC
  Filled 2013-09-20: qty 400

## 2013-09-20 MED ORDER — VANCOMYCIN HCL 10 G IV SOLR
1250.0000 mg | Freq: Two times a day (BID) | INTRAVENOUS | Status: DC
  Administered 2013-09-20 – 2013-09-22 (×4): 1250 mg via INTRAVENOUS
  Filled 2013-09-20 (×4): qty 1250

## 2013-09-20 MED ORDER — LACTULOSE 10 GM/15ML PO SOLN
20.0000 g | Freq: Every day | ORAL | Status: DC
  Administered 2013-09-21 – 2013-09-24 (×4): 20 g via ORAL
  Filled 2013-09-20 (×4): qty 30

## 2013-09-20 MED ORDER — VANCOMYCIN HCL IN DEXTROSE 1-5 GM/200ML-% IV SOLN
1000.0000 mg | INTRAVENOUS | Status: AC
  Administered 2013-09-20 (×2): 1000 mg via INTRAVENOUS
  Filled 2013-09-20 (×2): qty 200

## 2013-09-20 MED ORDER — INSULIN ASPART 100 UNIT/ML ~~LOC~~ SOLN
0.0000 [IU] | Freq: Three times a day (TID) | SUBCUTANEOUS | Status: DC
  Administered 2013-09-20: 5 [IU] via SUBCUTANEOUS
  Administered 2013-09-20: 8 [IU] via SUBCUTANEOUS
  Administered 2013-09-21: 3 [IU] via SUBCUTANEOUS
  Administered 2013-09-21: 8 [IU] via SUBCUTANEOUS
  Administered 2013-09-21 – 2013-09-22 (×2): 5 [IU] via SUBCUTANEOUS
  Administered 2013-09-22: 11 [IU] via SUBCUTANEOUS
  Administered 2013-09-22: 8 [IU] via SUBCUTANEOUS

## 2013-09-20 MED ORDER — VANCOMYCIN HCL 10 G IV SOLR
2000.0000 mg | Freq: Once | INTRAVENOUS | Status: DC
  Filled 2013-09-20: qty 2000

## 2013-09-20 MED ORDER — INSULIN ASPART 100 UNIT/ML ~~LOC~~ SOLN
0.0000 [IU] | Freq: Every day | SUBCUTANEOUS | Status: DC
  Administered 2013-09-20: 3 [IU] via SUBCUTANEOUS
  Administered 2013-09-21: 2 [IU] via SUBCUTANEOUS

## 2013-09-20 MED ORDER — INSULIN DETEMIR 100 UNIT/ML ~~LOC~~ SOLN
15.0000 [IU] | Freq: Every day | SUBCUTANEOUS | Status: DC
  Administered 2013-09-20 – 2013-09-21 (×2): 15 [IU] via SUBCUTANEOUS
  Filled 2013-09-20 (×3): qty 0.15

## 2013-09-20 MED ORDER — PROPRANOLOL HCL 20 MG PO TABS
20.0000 mg | ORAL_TABLET | Freq: Two times a day (BID) | ORAL | Status: DC
  Administered 2013-09-20 – 2013-09-24 (×9): 20 mg via ORAL
  Filled 2013-09-20 (×10): qty 1

## 2013-09-20 NOTE — Progress Notes (Signed)
PT TRANSFERRING TO ROOM 316. PT ALERT AND ORIENTED. AFEBRILE. IV NSL X2 PATENT. FOLEY CATH PATENT DRAINING AMBER URINE. VSS. DIHARREA FROM LACTULOSE IS SLOWING DOWN.TRANFER REPORT CALLED TO MATTHEW RN ON 300. TRANSFERRED VIA W/C.

## 2013-09-20 NOTE — Progress Notes (Signed)
[facility] PROGRESS NOTE  Caleb Riley ZOX:[id number] DOB: 04-17-1951 DOA: 09/18/2013 PCP: No primary provider on file.  Assessment/Plan: 1. Sepsis. Patient has 2 out of 2 blood cultures positive for gram-positive cocci in clusters. He is currently on vancomycin. We'll check a 2-D echocardiogram. There does not appear to be a clear source for his bacteremia. May need to treat him empirically for a total of 4-6 weeks, since TEE would be risky in light of his advanced liver disease, thrombocytopenia and esophageal varices. Will likely need further input from infectious disease service once identifications are available in blood cultures. Suspect Staphylococcus aureus bacteremia. He will likely need to go home with a PICC line. We'll continue current treatments until further data is available. 2. Urinary tract infection. Urine culture positive for group B streptococci. He should be covered by vancomycin. Last febrile episode was yesterday. 3. Hepatic encephalopathy. Ammonia level is improved today with a Xifaxan and lactulose. Mental status is also improved today. 4. 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 Patient appears to have cirrhosis on CT imaging with portal hypertension and significant varices. His daughter reports that they were told that his liver disease was related to his prior alcohol abuse. The patient no longer drinks alcohol. We'll need to request more records from his primary care doctor/gastroenterologist on Monday. Restart the patient on propranolol 5. Thrombocytopenia is likely a chronic finding due to her underlying liver disease. Continue to follow. No evidence of bleeding. 6. Diabetes. Start the patient on Levemir and sliding scale insulin.  Code Status: full code Family Communication: discussed with daughter victoria over the phone Disposition Plan: discharge home once improved, transferred to telemetry bed later today if patient continues to  improve   Consultants:  none  Procedures:  none  Antibiotics:  Rocephin 12/26>> 12/27  Vancomycin 12/27>>  HPI/Subjective: Patient is more awake today.  Feeling better today.  Admits to frequent stools from lactulose, no nausea or vomiting, does not feel short of breath  Objective: Filed Vitals:   09/20/13 1100  BP: 150/69  Pulse: 84  Temp:   Resp: 19    Intake/Output Summary (Last 24 hours) at 09/20/13 1122 Last data filed at 09/20/13 1000  Gross per 24 hour  Intake 3365.84 ml  Output   1282 ml  Net 2083.84 ml   Filed Weights   09/19/13 0444 09/20/13 0450  Weight: 109.4 kg (241 lb 2.9 oz) 114.9 kg (253 lb 4.9 oz)    Exam:   General:  NAD  Cardiovascular: S1, S2 RRR  Respiratory: CTA B  Abdomen: soft, nt, distended, bs+  Musculoskeletal: trace edema b/l   Data Reviewed: Basic Metabolic Panel:  Recent Labs Lab 09/18/13 2322 09/19/13 0648 09/19/13 1230 09/20/13 0515  NA 141  --  140 139  K 3.7  --  3.4* 3.8  CL 107  --  109 111  CO2 22  --  19 19  GLUCOSE 164*  --  69* 184*  BUN 15  --  18 22  CREATININE 1.03 1.21 1.22 1.13  CALCIUM 8.3*  --  7.5* 7.0*   Liver Function Tests:  Recent Labs Lab 09/18/13 2322 09/20/13 0515  AST 34 31  ALT 35 27  ALKPHOS 163* 61  BILITOT 2.0* 1.9*  PROT 6.2 5.0*  ALBUMIN 2.8* 2.1*   No results found for this basename: LIPASE, AMYLASE,  in the last 168 hours  Recent Labs Lab 09/19/13 0649 09/20/13 1009  AMMONIA 81* 45   CBC:  Recent Labs Lab 09/18/13  2322 09/19/13 0648 09/20/13 0515  WBC 4.8 5.1 3.1*  NEUTROABS 4.2  --   --   HGB 13.7 12.8* 11.1*  HCT 38.7* 36.8* 31.9*  MCV 89.4 90.4 91.7  PLT 31* 25* 23*   Cardiac Enzymes:  Recent Labs Lab 09/18/13 2350  TROPONINI <0.30   BNP (last 3 results) No results found for this basename: PROBNP,  in the last 8760 hours CBG:  Recent Labs Lab 09/19/13 1741 09/19/13 2011 09/19/13 2341 09/20/13 0352 09/20/13 0747  GLUCAP 177*  214* 194* 209* 172*    Recent Results (from the past 240 hour(s))  URINE CULTURE     Status: None   Collection Time    09/19/13 12:12 AM      Result Value Range Status   Specimen Description URINE, CATHETERIZED   Final   Special Requests NONE   Final   Culture  Setup Time     Final   Value: 09/19/2013 14:29     Performed at [facility] Count     Final   Value: >=100,000 COLONIES/ML     Performed at [facility]   Culture     Final   Value: GROUP B STREP(S.AGALACTIAE)ISOLATED     Note: TESTING AGAINST S. AGALACTIAE NOT ROUTINELY PERFORMED DUE TO PREDICTABILITY OF AMP/PEN/VAN SUSCEPTIBILITY.     Performed at [facility]   Report Status 09/20/2013 FINAL   Final  CULTURE, BLOOD (ROUTINE X 2)     Status: None   Collection Time    09/19/13  1:11 AM      Result Value Range Status   Specimen Description Blood   Final   Special Requests Normal   Final   Culture     Final   Value: GRAM POSITIVE COCCI IN CLUSTERS     Gram Stain Report Called to,Read Back By and Verified With: B.LEE AT 2316 ON 09/19/13 BY S.VANHOORNE   Report Status PENDING   Incomplete  CULTURE, BLOOD (ROUTINE X 2)     Status: None   Collection Time    09/19/13  1:13 AM      Result Value Range Status   Specimen Description Blood LEFT HAND   Final   Special Requests BOTTLES DRAWN AEROBIC AND ANAEROBIC 6 CC EACH   Final   Culture     Final   Value: GRAM POSITIVE COCCI IN CLUSTERS     Gram Stain Report Called to,Read Back By and Verified With: D.BREWER RN AT 2000 ON 09/19/13 BY S.VANHOORNE   Report Status PENDING   Incomplete  MRSA PCR SCREENING     Status: None   Collection Time    09/19/13  1:28 PM      Result Value Range Status   MRSA by PCR NEGATIVE  NEGATIVE Final   Comment:            The GeneXpert MRSA Assay (FDA     approved for NASAL specimens     only), is one component of a     comprehensive MRSA colonization     surveillance program. It is not     intended to  diagnose MRSA     infection nor to guide or     monitor treatment for     MRSA infections.  CLOSTRIDIUM DIFFICILE BY PCR     Status: None   Collection Time    09/19/13  4:55 PM      Result Value Range Status   C  difficile by pcr NEGATIVE  NEGATIVE Final     Studies: Ct Head Wo Contrast  09/19/2013   CLINICAL DATA:  Hypertension, diabetes, altered mental status. Hyperglycemic and elevated blood pressure.  EXAM: CT HEAD WITHOUT CONTRAST  TECHNIQUE: Contiguous axial images were obtained from the base of the skull through the vertex without intravenous contrast.  COMPARISON:  None available for comparison at time of study interpretation.  FINDINGS: The ventricles and sulci are normal for age. No intraparenchymal hemorrhage, mass effect nor midline shift. Minimal patchy supratentorial white matter to hypodensities may reflect chronic small vessel ischemic disease. No acute large vascular territory infarcts.  No abnormal extra-axial fluid collections. Basal cisterns are patent. Mild calcific atherosclerosis of the carotid siphons.  No skull fracture. Paranasal sinus mucosal thickening without air-fluid levels. Mastoid air cells appear well-aerated. Moderate right temporomandibular osteoarthrosis. The included ocular globes and orbital contents are non-suspicious. Symmetric medial deviation of the lamina papyracea suggests normal variant.  IMPRESSION: No acute intracranial process.  Mild paranasal sinusitis.   Electronically Signed   By: Awilda Metro   On: 09/19/2013 01:36   Ct Abdomen Pelvis W Contrast  09/19/2013   CLINICAL DATA:  Pain.  Nausea.  EXAM: CT ABDOMEN AND PELVIS WITH CONTRAST  TECHNIQUE: Multidetector CT imaging of the abdomen and pelvis was performed using the standard protocol following bolus administration of intravenous contrast.  CONTRAST:  50mL OMNIPAQUE IOHEXOL 300 MG/ML SOLN, OMNIPAQUE IOHEXOL 300 MG/ML SOLN  COMPARISON:  None.  FINDINGS: Liver is small and slightly  irregular. Prominent splenomegaly is present. Multiple periesophageal, perigastric, perisplenic, and retroperitoneal prominent varices are present. Umbilical vein is recanalized. These findings are consistent with cirrhosis with portal hypertension. Portal vein is patent. Splenic vein is patent.  Adrenals normal. Kidneys are normal. No hydronephrosis. No obstructing ureteral stone. Foley catheter within bladder. Air noted within bladder, most likely from instrumentation.  Shotty inguinal lymph nodes. Aorta normal caliber. Visceral vessels are patent.  Appendix normal. No inflammatory change in right or left lower quadrant. Stool noted throughout the colon. No bowel distention. No free air. Small sliding hiatal hernia. Soft tissue thickening noted in the region of the cecum most likely related to stool as opposed to true mass lesion. Mild ascites.  Lung bases clear.  Heart size normal.  No acute bony abnormality.  IMPRESSION: Cirrhosis with portal hypertension resulting in diffuse prominent varices and splenomegaly. Mild ascites.   Electronically Signed   By: Maisie Fus  Register   On: 09/19/2013 14:16   Dg Chest Port 1 View  09/19/2013   CLINICAL DATA:  Sepsis  EXAM: PORTABLE CHEST - 1 VIEW  COMPARISON:  09/19/2013 0011 hrs  FINDINGS: Cardiac shadow is again mildly enlarged. The lungs are well aerated without focal infiltrate. Mild vascular congestion is again seen.  IMPRESSION: No change from the prior study.   Electronically Signed   By: Alcide Clever M.D.   On: 09/19/2013 12:55   Dg Chest Port 1 View  09/19/2013   CLINICAL DATA:  Shortness of breath and chest pain.  EXAM: PORTABLE CHEST - 1 VIEW  COMPARISON:  None.  FINDINGS: The lungs are hypoexpanded. Vascular congestion is noted, with question of minimal interstitial edema. There is no evidence of pleural effusion or pneumothorax.  The cardiomediastinal silhouette is borderline enlarged. No acute osseous abnormalities are seen.  IMPRESSION: Lungs  hypoexpanded. Vascular congestion and borderline cardiomegaly, with question of minimal interstitial edema.   Electronically Signed   By: Beryle Beams.D.  On: 09/19/2013 00:16    Scheduled Meds: . cefTRIAXone (ROCEPHIN)  IV  1 g Intravenous QHS  . insulin aspart  0-9 Units Subcutaneous Q4H  . lactulose  20 g Oral TID  . rifaximin  550 mg Oral BID  . sodium chloride  3 mL Intravenous Q12H  . tamsulosin  0.4 mg Oral QPC supper  . vancomycin  1,250 mg Intravenous Q12H   Continuous Infusions: . 0.9 % NaCl with KCl 20 mEq / L Stopped (09/20/13 1100)    Principal Problem:   Sepsis Active Problems:   UTI (lower urinary tract infection)   Lethargy   Diabetes mellitus, type 2   Essential hypertension, benign   Hypotension, unspecified   Encephalopathy, hepatic   Thrombocytopenia, unspecified   Gram-positive bacteremia   Cirrhosis of liver    Time spent:    MEMON,JEHANZEB  [facility] Pager [phone]. If 7PM-7AM, please contact night-coverage at [email or web address], password [facility] 09/20/2013, 11:22 AM  LOS: 2 days

## 2013-09-20 NOTE — Progress Notes (Signed)
ANTIBIOTIC CONSULT NOTE - INITIAL  Pharmacy Consult for vancomycin Indication: uro-sepsis  No Known Allergies  Patient Measurements: Height: 5\' 8"  (172.7 cm) Weight: 253 lb 4.9 oz (114.9 kg) IBW/kg (Calculated) : 68.4 Adjusted Body Weight: 84kg  Vital Signs: Temp: 98.9 F (37.2 C) (12/27 0336) Temp src: Oral (12/27 0336) BP: 128/56 mmHg (12/27 0500) Pulse Rate: 89 (12/27 0500) Intake/Output from previous day: 12/26 0701 - 12/27 0700 In: 2625.8 [I.V.:2575.8; IV Piggyback:50] Out: 1282 [Urine:1275; Stool:7] Intake/Output from this shift: Total I/O In: 2625.8 [I.V.:2575.8; IV Piggyback:50] Out: 1280 [Urine:1275; Stool:5]  Labs:  Recent Labs  09/18/13 2322 09/19/13 0648 09/19/13 1230  WBC 4.8 5.1  --   HGB 13.7 12.8*  --   PLT 31* 25*  --   CREATININE 1.03 1.21 1.22   Estimated Creatinine Clearance: 79.2 ml/min (by C-G formula based on Cr of 1.22).    Microbiology: Recent Results (from the past 720 hour(s))  CULTURE, BLOOD (ROUTINE X 2)     Status: None   Collection Time    09/19/13  1:11 AM      Result Value Range Status   Specimen Description Blood   Final   Special Requests Normal   Final   Culture     Final   Value: GRAM POSITIVE COCCI IN CLUSTERS     Gram Stain Report Called to,Read Back By and Verified With: B.LEE AT 2316 ON 09/19/13 BY S.VANHOORNE   Report Status PENDING   Incomplete  CULTURE, BLOOD (ROUTINE X 2)     Status: None   Collection Time    09/19/13  1:13 AM      Result Value Range Status   Specimen Description Blood LEFT HAND   Final   Special Requests BOTTLES DRAWN AEROBIC AND ANAEROBIC 6 CC EACH   Final   Culture     Final   Value: GRAM POSITIVE COCCI IN CLUSTERS     Gram Stain Report Called to,Read Back By and Verified With: D.BREWER RN AT 2000 ON 09/19/13 BY S.VANHOORNE   Report Status PENDING   Incomplete  MRSA PCR SCREENING     Status: None   Collection Time    09/19/13  1:28 PM      Result Value Range Status   MRSA by PCR  NEGATIVE  NEGATIVE Final   Comment:            The GeneXpert MRSA Assay (FDA     approved for NASAL specimens     only), is one component of a     comprehensive MRSA colonization     surveillance program. It is not     intended to diagnose MRSA     infection nor to guide or     monitor treatment for     MRSA infections.  CLOSTRIDIUM DIFFICILE BY PCR     Status: None   Collection Time    09/19/13  4:55 PM      Result Value Range Status   C difficile by pcr NEGATIVE  NEGATIVE Final    Medical History: Past Medical History  Diagnosis Date  . Diabetes mellitus without complication   . Hypertension   . GERD (gastroesophageal reflux disease)   . Diarrhea, travelers'     Medications:  Scheduled:  . cefTRIAXone (ROCEPHIN)  IV  1 g Intravenous QHS  . insulin aspart  0-9 Units Subcutaneous Q4H  . lactulose  20 g Oral TID  . rifaximin  550 mg Oral BID  . sodium  chloride  3 mL Intravenous Q12H  . tamsulosin  0.4 mg Oral QPC supper  . vancomycin  1,250 mg Intravenous Q12H  . vancomycin  2,000 mg Intravenous Once   Infusions:  . 0.9 % NaCl with KCl 20 mEq / L 100 mL/hr at 09/20/13 0500   PRN: acetaminophen, ondansetron (ZOFRAN) IV  Assessment: 76yr male being tx'd for uro-sepsis; started on Rocephin 1gm IV q24h last night approx 2100, now with blood cultures showing gram+ cocci.  Vancomycin to be added.  Goal of Therapy:  Desire vancomycin trough to be 15-32mcg/ml  Plan:  1.  Loading dose= 2000mg  Vancomycin IV x 1, then 2.  Maintenance regimen 1250mg  vancomycin IV q12h 3.  Will check vancomycin steady state trough 4.  Follow for renal function, and infection indices  Bauer Ausborn E 09/20/2013,5:48 AM

## 2013-09-21 DIAGNOSIS — A4901 Methicillin susceptible Staphylococcus aureus infection, unspecified site: Secondary | ICD-10-CM

## 2013-09-21 DIAGNOSIS — E669 Obesity, unspecified: Secondary | ICD-10-CM | POA: Diagnosis present

## 2013-09-21 DIAGNOSIS — D61818 Other pancytopenia: Secondary | ICD-10-CM

## 2013-09-21 LAB — HEPATITIS A ANTIBODY, TOTAL: Hep A Total Ab: REACTIVE — AB

## 2013-09-21 LAB — GLUCOSE, CAPILLARY
Glucose-Capillary: 170 mg/dL — ABNORMAL HIGH (ref 70–99)
Glucose-Capillary: 249 mg/dL — ABNORMAL HIGH (ref 70–99)
Glucose-Capillary: 281 mg/dL — ABNORMAL HIGH (ref 70–99)
Glucose-Capillary: 239 mg/dL — ABNORMAL HIGH (ref 70–99)

## 2013-09-21 LAB — CBC
HCT: 31.3 % — ABNORMAL LOW (ref 39.0–52.0)
Hemoglobin: 10.9 g/dL — ABNORMAL LOW (ref 13.0–17.0)
MCHC: 34.8 g/dL (ref 30.0–36.0)
Platelets: 25 10*3/uL — CL (ref 150–400)
RDW: 14.9 % (ref 11.5–15.5)
WBC: 2.6 10*3/uL — ABNORMAL LOW (ref 4.0–10.5)

## 2013-09-21 LAB — BASIC METABOLIC PANEL
BUN: 13 mg/dL (ref 6–23)
Chloride: 107 mEq/L (ref 96–112)
Creatinine, Ser: 0.82 mg/dL (ref 0.50–1.35)
GFR calc Af Amer: >90 mL/min (ref >90)
GFR calc non Af Amer: >90 mL/min (ref >90)
Potassium: 3.7 mEq/L (ref 3.5–5.1)
Sodium: 136 mEq/L (ref 135–145)

## 2013-09-21 LAB — HEPATITIS C ANTIBODY: HCV Ab: NEGATIVE

## 2013-09-21 LAB — HEPATITIS B SURFACE ANTIGEN: Hepatitis B Surface Ag: NEGATIVE

## 2013-09-21 MED ORDER — INSULIN DETEMIR 100 UNIT/ML ~~LOC~~ SOLN
20.0000 [IU] | Freq: Every day | SUBCUTANEOUS | Status: DC
  Administered 2013-09-22 – 2013-09-23 (×2): 20 [IU] via SUBCUTANEOUS
  Filled 2013-09-21 (×3): qty 0.2

## 2013-09-21 MED ORDER — CEFAZOLIN SODIUM-DEXTROSE 2-3 GM-% IV SOLR
2.0000 g | Freq: Three times a day (TID) | INTRAVENOUS | Status: DC
  Administered 2013-09-21 – 2013-09-24 (×10): 2 g via INTRAVENOUS
  Filled 2013-09-21 (×16): qty 50

## 2013-09-21 NOTE — Progress Notes (Signed)
Patients foley was removed per MD order. Patient expected to urinate within six hours. Will encourage urination and fluids. Will continue to monitor patient and urine output.

## 2013-09-21 NOTE — Consult Note (Signed)
[facility]    Date of Admission:  09/18/2013   Total days of antibiotics 2               Reason for Consult: [place] [facility] Program ([facility]) automatic consultation for staph aureus bacteremia     Principal Problem:   Staphylococcus aureus bacteremia Active Problems:   Sepsis   UTI (lower urinary tract infection)   Lethargy   Diabetes mellitus, type 2   Essential hypertension, benign   Hypotension, unspecified   Encephalopathy, hepatic   Pancytopenia   Cirrhosis of liver   Obesity   . insulin aspart  0-15 Units Subcutaneous TID WC  . insulin aspart  0-5 Units Subcutaneous QHS  . insulin detemir  15 Units Subcutaneous Daily  . lactulose  20 g Oral Daily  . propranolol  20 mg Oral BID  . rifaximin  550 mg Oral BID  . sodium chloride  3 mL Intravenous Q12H  . tamsulosin  0.4 mg Oral QPC supper  . vancomycin  1,250 mg Intravenous Q12H    Recommendations: 1. Continue vancomycin pending antibiotic susceptibility results 2. Add cefazolin pending antibiotic susceptibility results 3. Repeat blood cultures 4. Transthoracic echocardiogram 5. Hold off on PICC placement until blood cultures are negative 6. Check HIV PCR and hepatitis serologies   Assessment: Caleb Riley is a 62 y.o. male with diabetes who was admitted 2 days ago with fever, lethargy and hypotension. Both admission blood cultures are growing staph aureus. I would treat him with vancomycin and cefazolin now to cover both MSSA and MRSA pending antibiotic susceptibility results. I would not place a PICC line until blood cultures are negative at least at 48 hours. I agree with transthoracic echocardiogram and that a TEE may not be worth the risk given evidence of cirrhosis and varices on CT scan.  He needs evaluation for HIV and chronic hepatitis given his cirrhosis and pancytopenia.   Past Medical History  Diagnosis Date  . Diabetes mellitus without  complication   . Hypertension   . GERD (gastroesophageal reflux disease)   . Diarrhea, travelers'     History  Substance Use Topics  . Smoking status: Never Smoker   . Smokeless tobacco: Never Used  . Alcohol Use: No    History reviewed. No pertinent family history. No Known Allergies  OBJECTIVE: Blood pressure 129/61, pulse 69, temperature 98.1 F (36.7 C), temperature source Oral, resp. rate 18, height 5\' 8"  (1.727 m), weight 110.814 kg (244 lb 4.8 oz), SpO2 94.00%.  Lab Results Lab Results  Component Value Date   WBC 2.6* 09/21/2013   HGB 10.9* 09/21/2013   HCT 31.3* 09/21/2013   MCV 91.0 09/21/2013   PLT 25* 09/21/2013    Lab Results  Component Value Date   CREATININE 0.82 09/21/2013   BUN 13 09/21/2013   NA 136 09/21/2013   K 3.7 09/21/2013   CL 107 09/21/2013   CO2 21 09/21/2013    Lab Results  Component Value Date   ALT 27 09/20/2013   AST 31 09/20/2013   ALKPHOS 61 09/20/2013   BILITOT 1.9* 09/20/2013     Microbiology: Recent Results (from the past 240 hour(s))  URINE CULTURE     Status: None   Collection Time    09/19/13 12:12 AM      Result Value Range Status   Specimen Description URINE, CATHETERIZED   Final   Special Requests NONE  Final   Culture  Setup Time     Final   Value: 09/19/2013 14:29     Performed at [facility] Count     Final   Value: >=100,000 COLONIES/ML     Performed at [facility]   Culture     Final   Value: GROUP B STREP(S.AGALACTIAE)ISOLATED     Note: TESTING AGAINST S. AGALACTIAE NOT ROUTINELY PERFORMED DUE TO PREDICTABILITY OF AMP/PEN/VAN SUSCEPTIBILITY.     Performed at [facility]   Report Status 09/20/2013 FINAL   Final  CULTURE, BLOOD (ROUTINE X 2)     Status: None   Collection Time    09/19/13  1:11 AM      Result Value Range Status   Specimen Description Blood   Final   Special Requests Normal   Final   Culture  Setup Time     Final   Value: 09/20/2013 22:04      Performed at [facility]   Culture     Final   Value: STAPHYLOCOCCUS AUREUS     Note: Gram Stain Report Called to,Read Back By and Verified With: B.LEE AT 2316 ON 09/19/13 BY S.VANHOORNE Performed at [facility]     Performed at [facility]   Report Status PENDING   Incomplete  CULTURE, BLOOD (ROUTINE X 2)     Status: None   Collection Time    09/19/13  1:13 AM      Result Value Range Status   Specimen Description Blood LEFT HAND   Final   Special Requests BOTTLES DRAWN AEROBIC AND ANAEROBIC 6 CC EACH   Final   Culture  Setup Time     Final   Value: 09/20/2013 21:35     Performed at [facility]   Culture     Final   Value: STAPHYLOCOCCUS AUREUS     Note: RIFAMPIN AND GENTAMICIN SHOULD NOT BE USED AS SINGLE DRUGS FOR TREATMENT OF STAPH INFECTIONS.     Note: Gram Stain Report Called to,Read Back By and Verified With: D.BREWER RN AT 2000 ON 09/19/13 BY S.VANHOORNE Performed at [facility]     Performed at [facility]   Report Status PENDING   Incomplete  MRSA PCR SCREENING     Status: None   Collection Time    09/19/13  1:28 PM      Result Value Range Status   MRSA by PCR NEGATIVE  NEGATIVE Final   Comment:            The GeneXpert MRSA Assay (FDA     approved for NASAL specimens     only), is one component of a     comprehensive MRSA colonization     surveillance program. It is not     intended to diagnose MRSA     infection nor to guide or     monitor treatment for     MRSA infections.  CLOSTRIDIUM DIFFICILE BY PCR     Status: None   Collection Time    09/19/13  4:55 PM      Result Value Range Status   C difficile by pcr NEGATIVE  NEGATIVE Final    Cliffton Asters, MD [facility] [phone] pager   [phone] cell 09/21/2013, 11:40 AM

## 2013-09-21 NOTE — Progress Notes (Signed)
[facility] PROGRESS NOTE  Caleb Riley ION:[id number] DOB: Jun 01, 1951 DOA: 09/18/2013 PCP: No primary provider on file. diabetes is managed by Dr. Renaee Munda in [place]. gastroenterologist Dr. Allena Katz In [place]  Assessment/Plan: 1. Sepsis with staph aureus bacteremia. Continue vancomycin, Ancef prior infectious disease. 2-D echocardiogram. TEE may be risky in light of his advanced liver disease, thrombocytopenia and esophageal varices. Source is unclear. No history of skin infection or implanted hardware. 2. UTI, GBS: Treated with vancomycin. 3. Hepatic encephalopathy, appears resolved. Continue Xifaxan, reduce lactulose. 4. 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 Continue propranolol. 5. Diabetes mellitus, stable 6. Pancytopenia, presumably secondary to alcohol and a cirrhosis   Per infectious disease continue vancomycin pending susceptibility report. Cefazolin added pending susceptibility report  Repeat blood cultures today  TTE  Avoid PICC until blood cultures negative  Check HIV PCR and hepatitis serologies  Obtain further records from GI   reduce lactulose  Increase Levemir  Pending studies:   HIV  Hepatitis panel  Code Status: full code DVT prophylaxis: SCDs Family Communication: Discussed with her granddaughter about Disposition Plan: Home when improved  Brendia Sacks, MD  [facility]  Pager [phone] If 7PM-7AM, please contact night-coverage at [email or web address], password [facility] 09/21/2013, 3:10 PM  LOS: 3 days   Summary: 62 year old man presented with lethargy and high fever (greater than 104), hypotensive and encephalopathic. He is admitted for sepsis, UTI, decompensated cirrhosis.  Consultants:  ID per CHAMP program (remote only)  Procedures:  2-D echocardiogram  Antibiotics:  Cefazolin 12/28 >>   Vancomycin 12/27 >>   HPI/Subjective: Feels better. No pain. Breathing fine.  Eating okay.  Objective: Filed Vitals:   09/20/13 2127 09/21/13 0500 09/21/13 0530 09/21/13 1455  BP: 123/65  129/61 134/65  Pulse: 75  69 66  Temp: 97.9 F (36.6 C)  98.1 F (36.7 C) 98.6 F (37 C)  TempSrc: Oral  Oral Oral  Resp: 18  18 19   Height:      Weight:  112.9 kg (248 lb 14.4 oz) 110.814 kg (244 lb 4.8 oz)   SpO2: 95%  94% 97%    Intake/Output Summary (Last 24 hours) at 09/21/13 1510 Last data filed at 09/21/13 1458  Gross per 24 hour  Intake   1160 ml  Output   2000 ml  Net   -840 ml     Filed Weights   09/20/13 0450 09/21/13 0500 09/21/13 0530  Weight: 114.9 kg (253 lb 4.9 oz) 112.9 kg (248 lb 14.4 oz) 110.814 kg (244 lb 4.8 oz)    Exam:   Afebrile, vital signs stable  General: Appears calm and comfortable. Speech is fluent and clear.  Cardiovascular: Regular rate and rhythm. No murmur, rub or gallop.  Respiratory: Clear to auscultation bilaterally. No wheezes, rales or rhonchi. Normal respiratory effort.  Psychiatric: Grossly normal mood and affect. Speech fluent and appropriate.  Abdomen: Soft, nontender, nondistended  Data Reviewed:  Capillary blood sugars 1-200s  Influenza PCR negative  Basic metabolic panel unremarkable  Leukopenia, thrombocytopenia, anemia without significant change  Scheduled Meds: .  ceFAZolin (ANCEF) IV  2 g Intravenous Q8H  . insulin aspart  0-15 Units Subcutaneous TID WC  . insulin aspart  0-5 Units Subcutaneous QHS  . insulin detemir  15 Units Subcutaneous Daily  . lactulose  20 g Oral Daily  . propranolol  20 mg Oral BID  . rifaximin  550 mg Oral BID  . sodium chloride  3 mL Intravenous Q12H  . tamsulosin  0.4 mg Oral QPC supper  . vancomycin  1,250 mg Intravenous Q12H   Continuous Infusions:   Principal Problem:   Staphylococcus aureus bacteremia Active Problems:   Sepsis   UTI (lower urinary tract infection)   Lethargy   Diabetes mellitus, type 2   Essential hypertension, benign   Hypotension,  unspecified   Encephalopathy, hepatic   Pancytopenia   Cirrhosis of liver   Obesity   Time spent 25 minutes

## 2013-09-22 LAB — GLUCOSE, CAPILLARY
Glucose-Capillary: 274 mg/dL — ABNORMAL HIGH (ref 70–99)
Glucose-Capillary: 286 mg/dL — ABNORMAL HIGH (ref 70–99)

## 2013-09-22 LAB — CULTURE, BLOOD (ROUTINE X 2)

## 2013-09-22 LAB — VANCOMYCIN, TROUGH: Vancomycin Tr: 10.9 ug/mL (ref 10.0–20.0)

## 2013-09-22 MED ORDER — VANCOMYCIN HCL 10 G IV SOLR: 1500.0000 mg | Freq: Two times a day (BID) | INTRAVENOUS | Status: DC

## 2013-09-22 MED ORDER — INSULIN ASPART 100 UNIT/ML ~~LOC~~ SOLN
0.0000 [IU] | Freq: Every day | SUBCUTANEOUS | Status: DC
  Administered 2013-09-23: 3 [IU] via SUBCUTANEOUS
  Administered 2013-09-23: 2 [IU] via SUBCUTANEOUS

## 2013-09-22 MED ORDER — INSULIN ASPART 100 UNIT/ML ~~LOC~~ SOLN
0.0000 [IU] | Freq: Three times a day (TID) | SUBCUTANEOUS | Status: DC
  Administered 2013-09-23: 15 [IU] via SUBCUTANEOUS
  Administered 2013-09-23: 11 [IU] via SUBCUTANEOUS
  Administered 2013-09-23: 4 [IU] via SUBCUTANEOUS
  Administered 2013-09-24: 15 [IU] via SUBCUTANEOUS
  Administered 2013-09-24: 7 [IU] via SUBCUTANEOUS

## 2013-09-22 NOTE — Care Management Note (Addendum)
    Page 1 of 2   09/24/2013     2:03:18 PM   CARE MANAGEMENT NOTE 09/24/2013  Patient:  Caleb Riley, Caleb Riley   Account Number:  [id number]  Date Initiated:  09/22/2013  Documentation initiated by:  Sharrie Rothman  Subjective/Objective Assessment:   Pt admitted from home with UTI and sepsis. Pt lives with his daughter and wants to return home at discharge. Pt seems fairly independent with ADL's. Pt will need IV AB for 4-6 weeks at discharge.     Action/Plan:   Will contact pts daughter Caleb Riley to see if they will be able to do IV AB at home or if we need to persue LTAC or SNF for AB therapy. Pts daughter does work at night and would be unable to do IV AB at night. Will continue to follow.   Anticipated DC Date:  09/24/2013   Anticipated DC Plan:  HOME W HOME HEALTH SERVICES      DC Planning Services  CM consult      Choice offered to / List presented to:          [facility] arranged  HH-1 RN  IV Antibiotics      HH agency  [facility]   Status of service:  Completed, signed off Medicare Important Message given?   (If response is "NO", the following Medicare IM given date fields will be blank) Date Medicare IM given:   Date Additional Medicare IM given:    Discharge Disposition:  HOME W HOME HEALTH SERVICES  Per UR Regulation:    If discussed at Long Length of Stay Meetings, dates discussed:    Comments:  09/24/13 Rosemary Holms RN BSN CM Pt getting PICC line. Plan is to DC today after his 2pm IV AB and [facility] to follow next dose at home. 1400 Per Dr. Gildardo Pounds in infectious disease, Dr. Daiva Eves will follow [facility] RN and Picc line. Alroy Bailiff with [facility] notified. Daughter Caleb Riley also notified.  09/22/13 1500 Arlyss Queen, RN BSN CM Pts daughters Caleb Riley and Caleb Riley will learn how to given IN AB at discharge with [facility]. Alroy Bailiff of [facility] is aware and will collect the pts information from the chart. Pt potential discharge on Wednesday, Dec 31. 09/22/13 1322 Arlyss Queen, RN BSN CM

## 2013-09-22 NOTE — Evaluation (Signed)
Occupational Therapy Evaluation Patient Details Name: Caleb Riley MRN: [id number] DOB: 1951-06-02 Today's Date: 09/22/2013 Time: 1308-6578 OT Time Calculation (min): 17 min Evaluation Only   OT Assessment / Plan / Recommendation History of present illness Caleb Riley is a 62 y.o. male brought in by ambulance lethargic and febrile. He was initially also hypotensive but received vigorous IV fluid resuscitation in the emergency room.  .  Orders received for OT eval and treat.     Clinical Impression   Patient demos functional ability at baseline with no complaints of pain.  Recommend no skilled OT services at this time.    OT Assessment  Patient does not need any further OT services    Follow Up Recommendations  No OT follow up    Barriers to Discharge      Equipment Recommendations       Recommendations for Other Services    Frequency       Precautions / Restrictions Precautions Precautions: Fall Restrictions Weight Bearing Restrictions: No       ADL  Eating/Feeding: Performed;Independent Grooming: Simulated;Modified independent Lower Body Dressing: Performed;Supervision/safety    OT Goals(Current goals can be found in the care plan section) Acute Rehab OT Goals Patient Stated Goal: none stated OT Goal Formulation: With patient  Visit Information  History of Present Illness: Caleb Riley is a 62 y.o. male brought in by ambulance lethargic and febrile. He was initially also hypotensive but received vigorous IV fluid resuscitation in the emergency room.  .  Orders received for OT eval and treat.         Prior Functioning     Home Living Family/patient expects to be discharged to:: Private residence Living Arrangements: Children Type of Home: Mobile home Home Access: Stairs to enter Entrance Stairs-Number of Steps: 4 Home Equipment: Walker - 2 wheels Prior Function Level of Independence: Independent Comments: Pt states normally ambulates without an assistive  device. Communication Communication: Prefers language other than English (however is able to communicate in [place] ) Dominant Hand: Right         Vision/Perception Vision - History Baseline Vision: Wears glasses only for reading Vision - Assessment Eye Alignment: Within Functional Limits Perception Perception: Within Functional Limits   Cognition  Cognition Arousal/Alertness: Awake/alert;Lethargic Behavior During Therapy: WFL for tasks assessed/performed Overall Cognitive Status: Within Functional Limits for tasks assessed    Extremity/Trunk Assessment Upper Extremity Assessment Upper Extremity Assessment: Overall WFL for tasks assessed Lower Extremity Assessment Lower Extremity Assessment: Defer to PT evaluation     Mobility Bed Mobility Bed Mobility: Rolling Left Rolling Left: 6: Modified independent (Device/Increase time);With rail Transfers Transfers: Sit to Stand;Stand to Sit Sit to Stand: 6: Modified independent (Device/Increase time) Stand to Sit: 6: Modified independent (Device/Increase time)     Exercise     Balance     End of Session OT - End of Session Activity Tolerance: Patient tolerated treatment well Patient left: in bed;with bed alarm set  GO     Velora Mediate, OTR/L  09/22/2013, 8:49 AM

## 2013-09-22 NOTE — Progress Notes (Signed)
Inpatient Diabetes Program Recommendations  AACE/ADA: New Consensus Statement on Inpatient Glycemic Control (2013)  Target Ranges:  Prepandial:   less than 140 mg/dL      Peak postprandial:   less than 180 mg/dL (1-2 hours)      Critically ill patients:  140 - 180 mg/dL   Results for ROXY, FILLER (MRN [id number]) as of 09/22/2013 08:44  Ref. Range 09/21/2013 07:30 09/21/2013 11:45 09/21/2013 16:24 09/21/2013 22:05 09/22/2013 07:33  Glucose-Capillary Latest Range: 70-99 mg/dL 478 (H) 295 (H) 621 (H) 239 (H) 220 (H)    Inpatient Diabetes Program Recommendations Insulin - Basal: Note Levemir was increased to Levemir 20 units daily on 12/28 @ 16:20 and patient is scheduled to receive first dose this morning at 10am.  Therefore, patient only received Levemir 15 units on 12/28. Correction (SSI): Please consider increasing Novolog correction to resistant scale.  Thanks, Orlando Penner, RN, MSN, CCRN Diabetes Coordinator Inpatient Diabetes Program [phone] (Team Pager) [phone] ([facility] office) [phone] [facility] office)

## 2013-09-22 NOTE — Progress Notes (Signed)
ANTIBIOTIC CONSULT NOTE  Pharmacy Consult for Vancomycin Indication: UTI, bacteremia  No Known Allergies  Patient Measurements: Height: 5\' 8"  (172.7 cm) Weight: 250 lb 1.6 oz (113.445 kg) IBW/kg (Calculated) : 68.4  Vital Signs: Temp: 98.3 F (36.8 C) (12/29 0515) Temp src: Oral (12/29 0515) BP: 124/71 mmHg (12/29 0519) Pulse Rate: 62 (12/29 0515) Intake/Output from previous day: 12/28 0701 - 12/29 0700 In: 1460 [P.O.:910; IV Piggyback:550] Out: 1175 [Urine:1175] Intake/Output from this shift: Total I/O In: 3 [I.V.:3] Out: -   Labs:  Recent Labs  09/19/13 1230 09/20/13 0515 09/21/13 0547  WBC  --  3.1* 2.6*  HGB  --  11.1* 10.9*  PLT  --  23* 25*  CREATININE 1.22 1.13 0.82   Estimated Creatinine Clearance: 117.1 ml/min (by C-G formula based on Cr of 0.82).    Microbiology: Recent Results (from the past 720 hour(s))  URINE CULTURE     Status: None   Collection Time    09/19/13 12:12 AM      Result Value Range Status   Specimen Description URINE, CATHETERIZED   Final   Special Requests NONE   Final   Culture  Setup Time     Final   Value: 09/19/2013 14:29     Performed at [facility] Count     Final   Value: >=100,000 COLONIES/ML     Performed at [facility]   Culture     Final   Value: GROUP B STREP(S.AGALACTIAE)ISOLATED     Note: TESTING AGAINST S. AGALACTIAE NOT ROUTINELY PERFORMED DUE TO PREDICTABILITY OF AMP/PEN/VAN SUSCEPTIBILITY.     Performed at [facility]   Report Status 09/20/2013 FINAL   Final  CULTURE, BLOOD (ROUTINE X 2)     Status: None   Collection Time    09/19/13  1:11 AM      Result Value Range Status   Specimen Description Blood   Final   Special Requests Normal   Final   Culture  Setup Time     Final   Value: 09/20/2013 22:04     Performed at [facility]   Culture     Final   Value: STAPHYLOCOCCUS AUREUS     Note: SUSCEPTIBILITIES PERFORMED ON PREVIOUS CULTURE WITHIN THE LAST 5  DAYS.     Note: Gram Stain Report Called to,Read Back By and Verified With: B.LEE AT 2316 ON 09/19/13 BY S.VANHOORNE Performed at [facility]     Performed at [facility]   Report Status 09/22/2013 FINAL   Final  CULTURE, BLOOD (ROUTINE X 2)     Status: None   Collection Time    09/19/13  1:13 AM      Result Value Range Status   Specimen Description Blood LEFT HAND   Final   Special Requests BOTTLES DRAWN AEROBIC AND ANAEROBIC 6 CC EACH   Final   Culture  Setup Time     Final   Value: 09/20/2013 21:35     Performed at [facility]   Culture     Final   Value: STAPHYLOCOCCUS AUREUS     Note: RIFAMPIN AND GENTAMICIN SHOULD NOT BE USED AS SINGLE DRUGS FOR TREATMENT OF STAPH INFECTIONS.     Note: Gram Stain Report Called to,Read Back By and Verified With: D.BREWER RN AT 2000 ON 09/19/13 BY S.VANHOORNE Performed at [facility]     Performed at [facility]   Report Status 09/22/2013 FINAL  Final   Organism ID, Bacteria STAPHYLOCOCCUS AUREUS   Final  MRSA PCR SCREENING     Status: None   Collection Time    09/19/13  1:28 PM      Result Value Range Status   MRSA by PCR NEGATIVE  NEGATIVE Final   Comment:            The GeneXpert MRSA Assay (FDA     approved for NASAL specimens     only), is one component of a     comprehensive MRSA colonization     surveillance program. It is not     intended to diagnose MRSA     infection nor to guide or     monitor treatment for     MRSA infections.  CLOSTRIDIUM DIFFICILE BY PCR     Status: None   Collection Time    09/19/13  4:55 PM      Result Value Range Status   C difficile by pcr NEGATIVE  NEGATIVE Final    Medical History: Past Medical History  Diagnosis Date  . Diabetes mellitus without complication   . Hypertension   . GERD (gastroesophageal reflux disease)   . Diarrhea, travelers'     Medications:  Scheduled:  .  ceFAZolin (ANCEF) IV  2 g Intravenous Q8H  . insulin aspart  0-15 Units  Subcutaneous TID WC  . insulin aspart  0-5 Units Subcutaneous QHS  . insulin detemir  20 Units Subcutaneous Daily  . lactulose  20 g Oral Daily  . propranolol  20 mg Oral BID  . rifaximin  550 mg Oral BID  . sodium chloride  3 mL Intravenous Q12H  . tamsulosin  0.4 mg Oral QPC supper  . vancomycin  1,250 mg Intravenous Q12H   Infusions:    PRN: acetaminophen, ondansetron (ZOFRAN) IV  Assessment: 62yo male with GrpB Strep UTI and MSSA bacteremia.  Repeat blood cx & TTE pending.   Renal function has improved to patient's baseline.   Vancomycin trough is below desired goal for sepsis.   Ancef 12/28>> Vancomycin 12/27>> Rocephin 12/26>>12/27  Goal of Therapy:  Vancomycin trough 15-39mcg/ml  Plan:  1.  Increase Vancomycin 1500mg  IV q12h.  Recommend d/c Vanc since cx data sensitive to Ancef.  2.  Continue Ancef 2gm IV q8h 3.  Recheck Vancomycin level if continues  4.  Monitor renal function and repeat cx data    Elson Clan 09/22/2013,8:29 AM

## 2013-09-22 NOTE — Progress Notes (Signed)
09/22/13 1756 Late entry for 1700. Notified Dr. Irene Limbo 2D echo not completed today. Stated okay to be completed tomorrow. Earnstine Regal, RN

## 2013-09-22 NOTE — Progress Notes (Signed)
[facility] PROGRESS NOTE  Fields Oros ZOX:[id number] DOB: 01/02/1951 DOA: 09/18/2013 PCP: No primary provider on file. diabetes is managed by Dr. Renaee Munda in [place]. gastroenterologist Dr. Allena Katz In [place]  Assessment/Plan: 1. Sepsis with MSSA bacteremia. Clinically stable. Ancef per infectious disease. 2-D echocardiogram pending. TEE felt to be too risky in light of his advanced liver disease, thrombocytopenia and esophageal varices. Source is unclear. No history of skin infection or implanted hardware. 2. UTI, GBS: Treated. 3. Hepatic encephalopathy, resolved. Continue Xifaxan, reduce lactulose. 4. 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 Continue propranolol. 5. Diabetes mellitus, Levemir has been adjusted, adjust sliding scale insulin insulin 6. Pancytopenia, presumably secondary to alcohol and cirrhosis   Cefazolin monotherapy per ID  TTE pending  Avoid PICC until blood cultures negative at least 48 hours  Obtain further records from GI if possible  Increase sliding scale to resistant  Pending studies:   HIV  Code Status: full code DVT prophylaxis: SCDs Family Communication: Discussed with her granddaughter about Disposition Plan: Home when improved  Brendia Sacks, MD  [facility]  Pager [phone] If 7PM-7AM, please contact night-coverage at [email or web address], password [facility] 09/22/2013, 4:26 PM  LOS: 4 days   Summary: 62 year old man presented with lethargy and high fever (greater than 104), hypotensive and encephalopathic. He is admitted for sepsis, UTI, decompensated cirrhosis.  Consultants:  ID per [facility] program (remote only) occupational therapy, no followup needed    Procedures:  2-D echocardiogram pending  Antibiotics:  Cefazolin 12/28 >>   Vancomycin 12/27 >> 12/29  HPI/Subjective: Feels good. No complaints. No pain. No nausea or vomiting.  Objective: Filed Vitals:   09/22/13 0515 09/22/13 0516 09/22/13 0519 09/22/13 1409  BP: 113/61 111/69 124/71 147/78  Pulse: 62   66  Temp: 98.3 F (36.8 C)   98.2 F (36.8 C)  TempSrc: Oral   Oral  Resp: 18   18  Height:      Weight: 113.445 kg (250 lb 1.6 oz)     SpO2: 96%   92%    Intake/Output Summary (Last 24 hours) at 09/22/13 1626 Last data filed at 09/22/13 1416  Gross per 24 hour  Intake   1513 ml  Output    475 ml  Net   1038 ml     Filed Weights   09/21/13 0500 09/21/13 0530 09/22/13 0515  Weight: 112.9 kg (248 lb 14.4 oz) 110.814 kg (244 lb 4.8 oz) 113.445 kg (250 lb 1.6 oz)    Exam:   Afebrile, vital signs stable. No hypoxia.  General: Appears calm and comfortable. Speech fluent and clear.  Cardiovascular: Regular rate and rhythm. No murmur, rub or gallop. No lower extremity edema.  Respiratory: Clear to auscultation bilaterally. No wheezes, rales or rhonchi. Normal respiratory effort.  Abdomen soft, nontender, nondistended.  Psychiatric: Grossly normal mood and affect. Speech fluent and appropriate.  Data Reviewed:  Urine output incompletely recorded, 7 voids are measured. Weight appears to be overall stable although there has been significant variation from day to day.  Hepatitis A positive  Capillary blood sugars somewhat elevated, 200-300  Scheduled Meds: .  ceFAZolin (ANCEF) IV  2 g Intravenous Q8H  . insulin aspart  0-15 Units Subcutaneous TID WC  . insulin aspart  0-5 Units Subcutaneous QHS  . insulin detemir  20 Units Subcutaneous Daily  . lactulose  20 g Oral Daily  . propranolol  20 mg Oral BID  . rifaximin  550 mg Oral BID  . sodium  chloride  3 mL Intravenous Q12H  . tamsulosin  0.4 mg Oral QPC supper   Continuous Infusions:   Principal Problem:   Staphylococcus aureus bacteremia Active Problems:   Sepsis   UTI (lower urinary tract infection)   Lethargy   Diabetes mellitus, type 2   Essential hypertension, benign   Hypotension, unspecified    Encephalopathy, hepatic   Pancytopenia   Cirrhosis of liver   Obesity   Time spent 20 minutes

## 2013-09-22 NOTE — Progress Notes (Signed)
[facility] Staph aureus bacteremia follow up:  1) MSSA confirmed, I have stopped the vancomycin 2) TTE pending; no TEE anticipated with liver disease 3) Hep C negative, awaiting HIV RNA 4) repeat blood cultures sent, picc if/when clear 48 hours 5) Anticipate 4-6 weeks of IV Ancef (multiple coomorbidities, no TEE)  [name], [name]

## 2013-09-23 DIAGNOSIS — I369 Nonrheumatic tricuspid valve disorder, unspecified: Secondary | ICD-10-CM

## 2013-09-23 LAB — GLUCOSE, CAPILLARY
Glucose-Capillary: 255 mg/dL — ABNORMAL HIGH (ref 70–99)
Glucose-Capillary: 330 mg/dL — ABNORMAL HIGH (ref 70–99)

## 2013-09-23 LAB — HIV-1 RNA QUANT-NO REFLEX-BLD: HIV-1 RNA Quant, Log: <1.3 {Log} (ref <1.30)

## 2013-09-23 MED ORDER — RIFAXIMIN 550 MG PO TABS
ORAL_TABLET | ORAL | Status: AC
  Filled 2013-09-23: qty 1

## 2013-09-23 MED ORDER — INSULIN DETEMIR 100 UNIT/ML ~~LOC~~ SOLN
25.0000 [IU] | Freq: Every day | SUBCUTANEOUS | Status: DC
  Administered 2013-09-24: 25 [IU] via SUBCUTANEOUS
  Filled 2013-09-23 (×4): qty 0.25

## 2013-09-23 NOTE — Progress Notes (Signed)
Inpatient Diabetes Program Recommendations  AACE/ADA: New Consensus Statement on Inpatient Glycemic Control (2013)  Target Ranges:  Prepandial:   less than 140 mg/dL      Peak postprandial:   less than 180 mg/dL (1-2 hours)      Critically ill patients:  140 - 180 mg/dL   Results for Caleb Riley, Caleb Riley (MRN [id number]) as of 09/23/2013 08:52  Ref. Range 09/22/2013 07:33 09/22/2013 11:12 09/22/2013 16:37 09/22/2013 21:18 09/23/2013 07:46  Glucose-Capillary Latest Range: 70-99 mg/dL 409 (H) 811 (H) 914 (H) 286 (H) 255 (H)   Inpatient Diabetes Program Recommendations Insulin - Basal: Please increase Levemir to 30 units daily.  Note: Blood glucose ranged from 220-315 mg/dl on 78/29 and fasting glucose this morning was 255 mg/dl.  Patient received Lantus 20 units at 10:07 am and Novolog correction was increased to resistant scale yesterday.  A total of Novolog 27 units was given yesterday for glucose correction.  Please increase Levemir to 30 units daily.  Will continue to follow.  Thanks, Orlando Penner, RN, MSN, CCRN Diabetes Coordinator Inpatient Diabetes Program [phone] (Team Pager) [phone] ([facility] office) [phone] [facility] office)

## 2013-09-23 NOTE — Progress Notes (Signed)
[facility] PROGRESS NOTE  Caleb Riley ZOX:[id number] DOB: Feb 06, 1951 DOA: 09/18/2013 PCP: No primary provider on file. diabetes is managed by Dr. Renaee Munda in [place]. gastroenterologist Dr. Allena Katz In [place]  Assessment/Plan: 1. Sepsis with MSSA bacteremia. Clinically stable. Ancef per infectious disease. 2-D echocardiogram no evidence of vegetation. TEE felt to be too risky in light of his advanced liver disease, thrombocytopenia and esophageal varices. Source is unclear. No history of skin infection or implanted hardware. 2. UTI, GBS: Treated. 3. Hepatic encephalopathy, resolved. Continue Xifaxan, reduce lactulose. 4. 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 Continue propranolol. Appears stable. 5. Diabetes mellitus, poor control, adjust Levemir 6. Pancytopenia, presumably secondary to alcohol and cirrhosis 7. Hepatitis A, followup with GI as an outpatient   Cefazolin monotherapy per ID  Plan PICC line 12/31 if repeat blood cultures remain no growth  Increase Levemir to 25 units daily  Anticipate discharge 12/31  Pending studies:   Blood cultures  Code Status: full code DVT prophylaxis: SCDs Family Communication: Discussed with her granddaughter about Disposition Plan: Home when improved  Brendia Sacks, MD  [facility]  Pager [phone] If 7PM-7AM, please contact night-coverage at [email or web address], password [facility] 09/23/2013, 2:16 PM  LOS: 5 days   Summary: 62 year old man presented with lethargy and high fever (greater than 104), hypotensive and encephalopathic. He is admitted for sepsis, UTI, decompensated cirrhosis.  Consultants:  ID per [facility] program (remote only) occupational therapy, no followup needed    Procedures:  2-D echocardiogram: Left ventricular ejection fraction 60-65%. Normal wall motion. No evidence of vegetation.  Antibiotics:  Cefazolin 12/28 >> 4-6 weeks  Vancomycin  12/27 >> 12/29  HPI/Subjective: Feels good. No pain. No nausea or vomiting. Eating well.  Objective: Filed Vitals:   09/23/13 0500 09/23/13 0552 09/23/13 0553 09/23/13 0555  BP:  146/64 128/66 144/71  Pulse:  64 67 66  Temp:  98.2 F (36.8 C)    TempSrc:  Oral    Resp:  20 20 21   Height:      Weight: 109.2 kg (240 lb 11.9 oz)     SpO2:  95% 94% 95%    Intake/Output Summary (Last 24 hours) at 09/23/13 1416 Last data filed at 09/22/13 1800  Gross per 24 hour  Intake    480 ml  Output      0 ml  Net    480 ml     Filed Weights   09/21/13 0530 09/22/13 0515 09/23/13 0500  Weight: 110.814 kg (244 lb 4.8 oz) 113.445 kg (250 lb 1.6 oz) 109.2 kg (240 lb 11.9 oz)    Exam:   Afebrile, vital signs stable. No hypoxia.  General: Appears calm and comfortable. Well-appearing.  Cardiovascular: Rate and rhythm. No murmur, rub or gallop.  Respiratory: Clear to auscultation bilaterally. No wheezes, rales or rhonchi. Normal respiratory effort.  Psychiatric: Grossly normal mood and affect. Speech fluent and appropriate.  Data Reviewed:  Capillary blood sugars remain elevated, 200-300s  Scheduled Meds: .  ceFAZolin (ANCEF) IV  2 g Intravenous Q8H  . insulin aspart  0-20 Units Subcutaneous TID WC  . insulin aspart  0-5 Units Subcutaneous QHS  . insulin detemir  20 Units Subcutaneous Daily  . lactulose  20 g Oral Daily  . propranolol  20 mg Oral BID  . rifaximin  550 mg Oral BID  . sodium chloride  3 mL Intravenous Q12H  . tamsulosin  0.4 mg Oral QPC supper   Continuous Infusions:   Principal Problem:  Staphylococcus aureus bacteremia Active Problems:   Sepsis   UTI (lower urinary tract infection)   Lethargy   Diabetes mellitus, type 2   Essential hypertension, benign   Hypotension, unspecified   Encephalopathy, hepatic   Pancytopenia   Cirrhosis of liver   Obesity   Time spent 15 minutes

## 2013-09-23 NOTE — Progress Notes (Signed)
*  PRELIMINARY RESULTS* Echocardiogram 2D Echocardiogram has been performed.  Danialle Dement 09/23/2013, 9:23 AM

## 2013-09-24 ENCOUNTER — Inpatient Hospital Stay (HOSPITAL_COMMUNITY): Payer: Medicare Other

## 2013-09-24 ENCOUNTER — Encounter (HOSPITAL_COMMUNITY): Payer: Medicare Other

## 2013-09-24 LAB — CBC
HCT: 30.1 % — ABNORMAL LOW (ref 39.0–52.0)
MCH: 31.8 pg (ref 26.0–34.0)
MCHC: 35.9 g/dL (ref 30.0–36.0)
MCV: 88.5 fL (ref 78.0–100.0)
Platelets: 27 10*3/uL — CL (ref 150–400)
RDW: 14.3 % (ref 11.5–15.5)
WBC: 1.9 10*3/uL — ABNORMAL LOW (ref 4.0–10.5)

## 2013-09-24 LAB — BASIC METABOLIC PANEL
BUN: 8 mg/dL (ref 6–23)
CO2: 23 mEq/L (ref 19–32)
Calcium: 7.8 mg/dL — ABNORMAL LOW (ref 8.4–10.5)
Chloride: 105 mEq/L (ref 96–112)
Creatinine, Ser: 0.74 mg/dL (ref 0.50–1.35)
Glucose, Bld: 257 mg/dL — ABNORMAL HIGH (ref 70–99)

## 2013-09-24 LAB — GLUCOSE, CAPILLARY: Glucose-Capillary: 247 mg/dL — ABNORMAL HIGH (ref 70–99)

## 2013-09-24 MED ORDER — SODIUM CHLORIDE 0.9 % IJ SOLN
10.0000 mL | INTRAMUSCULAR | Status: DC | PRN
  Administered 2013-09-24: 20 mL

## 2013-09-24 MED ORDER — PROPRANOLOL HCL 20 MG PO TABS: 20.0000 mg | ORAL_TABLET | Freq: Two times a day (BID) | ORAL | Status: AC

## 2013-09-24 MED ORDER — CEFAZOLIN SODIUM-DEXTROSE 2-3 GM-% IV SOLR: 2.0000 g | Freq: Three times a day (TID) | INTRAVENOUS | Status: DC

## 2013-09-24 NOTE — Discharge Summary (Signed)
Physician Discharge Summary  Caleb Riley RUE:[id number] DOB: 1951-03-01 DOA: 09/18/2013  PCP: No primary provider on file.  Admit date: 09/18/2013 Discharge date: 09/24/2013  Recommendations for Outpatient Follow-up:  1. Resolution of MSSA bacteremia  2. Home health RN for IV infusion of antibiotics. Weekly CBC on Ancef. Fax to ID [phone] Dr. Daiva Eves 3. Continued management of cirrhosis 4. Pancytopenia, secondary to cirrhosis  5. Diabetes mellitus 6. Note hepatitis A total antibody was positive likely reflecting previous infection  Follow-up Information   Follow up with [facility].   Contact information:   [address] [place] [address] [phone]      Follow up with [facility]              On 10/14/2013. Acey Lav MD, 2 pm)    Contact information:   [address] [place] [address]      Discharge Diagnoses:  1. Sepsis secondary to MSSA bacteremia 2. UTI, group B strep 3. Hepatic encephalopathy 4. 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 Diabetes mellitus 6. Pancytopenia  Discharge Condition: Improved Disposition: Home with home health RN for antibiotics   Diet recommendation: Heart healthy, diabetic  Filed Weights   09/22/13 0515 09/23/13 0500 09/24/13 0500  Weight: 113.445 kg (250 lb 1.6 oz) 109.2 kg (240 lb 11.9 oz) 110.496 kg (243 lb 9.6 oz)    History of present illness:  62 year old man presented with lethargy and high fever (greater than 104), hypotensive and encephalopathic. He is admitted for sepsis, UTI, decompensated cirrhosis.  Hospital Course:  [name] was a treat empirically for sepsis and UTI as well as decompensated cirrhosis. He rapidly improved. Blood cultures were positive for MSSA and antibiotic therapy was tailored accordingly in consultation with infectious disease. 2-D echocardiogram no  evidence of vegetation. UTI was successfully treated with antibiotics. Hepatic encephalopathy rapidly resolved with lactulose and Xifaxan. Liver disease currently stable as is pancytopenia likely related to acute infection and underlying liver disease.   Individual issues as below.  1. Sepsis with MSSA bacteremia. Clinically stable. Ancef per infectious disease for 6 weeks. 2-D echocardiogram no evidence of vegetation. TEE felt to be too risky in light of his advanced liver disease, thrombocytopenia and esophageal varices. Source is unclear. No history of skin infection or implanted hardware. Has infectious disease followup next month. PICC line was placed when second set of blood cultures no growth to date 72 hours. 2. UTI, GBS: Treated. 3. Hepatic encephalopathy, resolved. Continue Xifaxan, lactulose. 4. 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 Continue propranolol. Appears stable. 5. Diabetes mellitus, resume NPH/regular on discharge 6. Pancytopenia, presumably secondary to alcohol and cirrhosis, stable 7. Hepatitis A, followup with GI as an outpatient. Discussed with GI--likely reflects previous infection, no further evaluation indicated Cefazolin monotherapy per ID for 4-6 weeks, minimum through 10/19/13.  Has follow-up with ID Dr. Daiva Eves 10/14/13  Discharge home today with [facility]  Fax records to patient's GI physician  Discharge Instructions  Discharge Orders   Future Appointments Provider Department Dept Phone   10/14/2013 2:00 PM Randall Hiss, MD [facility] [phone]   Future Orders Complete By Expires   Diet - low sodium heart healthy  As directed    Diet Carb Modified  As directed    Discharge instructions  As directed    Comments:  Call physician for fever, confusion or worsening of condition.   Increase activity slowly  As directed         Medication List         ceFAZolin 2-3 GM-% Solr  Commonly known as:  ANCEF  Inject 50 mLs (2 g total) into the vein every 8 (eight) hours. Minimum through 10/19/13. Weekly CBC to faxed to [facility] for ID in [facility] [phone]     insulin NPH-regular (70-30) 100 UNIT/ML injection  Commonly known as:  NOVOLIN 70/30  Inject 40-75 Units into the skin 2 (two) times daily with a meal. Takes 75 units in the morning and 40 units at bedtime.     lactulose 10 GM/15ML solution  Commonly known as:  CHRONULAC  Take 30 g by mouth 2 (two) times daily.     losartan 100 MG tablet  Commonly known as:  COZAAR  Take 100 mg by mouth daily.     omeprazole 20 MG capsule  Commonly known as:  PRILOSEC  Take 20 mg by mouth daily.     propranolol 20 MG tablet  Commonly known as:  INDERAL  Take 1 tablet (20 mg total) by mouth 2 (two) times daily.     rifaximin 550 MG Tabs tablet  Commonly known as:  XIFAXAN  Take 550 mg by mouth 2 (two) times daily.     tamsulosin 0.4 MG Caps capsule  Commonly known as:  FLOMAX  Take 0.4 mg by mouth daily.       No Known Allergies  The results of significant diagnostics from this hospitalization (including imaging, microbiology, ancillary and laboratory) are listed below for reference.    Significant Diagnostic Studies: Ct Head Wo Contrast  09/19/2013   CLINICAL DATA:  Hypertension, diabetes, altered mental status. Hyperglycemic and elevated blood pressure.  EXAM: CT HEAD WITHOUT CONTRAST  TECHNIQUE: Contiguous axial images were obtained from the base of the skull through the vertex without intravenous contrast.  COMPARISON:  None available for comparison at time of study interpretation.  FINDINGS: The ventricles and sulci are normal for age. No intraparenchymal hemorrhage, mass effect nor midline shift. Minimal patchy supratentorial white matter to hypodensities may reflect chronic small vessel ischemic disease. No acute large vascular territory  infarcts.  No abnormal extra-axial fluid collections. Basal cisterns are patent. Mild calcific atherosclerosis of the carotid siphons.  No skull fracture. Paranasal sinus mucosal thickening without air-fluid levels. Mastoid air cells appear well-aerated. Moderate right temporomandibular osteoarthrosis. The included ocular globes and orbital contents are non-suspicious. Symmetric medial deviation of the lamina papyracea suggests normal variant.  IMPRESSION: No acute intracranial process.  Mild paranasal sinusitis.   Electronically Signed   By: Awilda Metro   On: 09/19/2013 01:36   Ct Abdomen Pelvis W Contrast  09/19/2013   CLINICAL DATA:  Pain.  Nausea.  EXAM: CT ABDOMEN AND PELVIS WITH CONTRAST  TECHNIQUE: Multidetector CT imaging of the abdomen and pelvis was performed using the standard protocol following bolus administration of intravenous contrast.  CONTRAST:  50mL OMNIPAQUE IOHEXOL 300 MG/ML SOLN, OMNIPAQUE IOHEXOL 300 MG/ML SOLN  COMPARISON:  None.  FINDINGS: Liver is small and slightly irregular. Prominent splenomegaly is present. Multiple periesophageal, perigastric, perisplenic, and retroperitoneal prominent varices are present. Umbilical vein is recanalized. These findings are consistent with cirrhosis with portal hypertension. Portal vein is patent. Splenic vein is patent.  Adrenals normal. Kidneys are normal. No hydronephrosis. No obstructing ureteral stone. Foley catheter within bladder. Air noted within bladder, most likely  from instrumentation.  Shotty inguinal lymph nodes. Aorta normal caliber. Visceral vessels are patent.  Appendix normal. No inflammatory change in right or left lower quadrant. Stool noted throughout the colon. No bowel distention. No free air. Small sliding hiatal hernia. Soft tissue thickening noted in the region of the cecum most likely related to stool as opposed to true mass lesion. Mild ascites.  Lung bases clear.  Heart size normal.  No acute bony abnormality.   IMPRESSION: Cirrhosis with portal hypertension resulting in diffuse prominent varices and splenomegaly. Mild ascites.   Electronically Signed   By: Maisie Fus  Register   On: 09/19/2013 14:16   Dg Chest Port 1 View  09/19/2013   CLINICAL DATA:  Sepsis  EXAM: PORTABLE CHEST - 1 VIEW  COMPARISON:  09/19/2013 0011 hrs  FINDINGS: Cardiac shadow is again mildly enlarged. The lungs are well aerated without focal infiltrate. Mild vascular congestion is again seen.  IMPRESSION: No change from the prior study.   Electronically Signed   By: Alcide Clever M.D.   On: 09/19/2013 12:55   Dg Chest Port 1 View  09/19/2013   CLINICAL DATA:  Shortness of breath and chest pain.  EXAM: PORTABLE CHEST - 1 VIEW  COMPARISON:  None.  FINDINGS: The lungs are hypoexpanded. Vascular congestion is noted, with question of minimal interstitial edema. There is no evidence of pleural effusion or pneumothorax.  The cardiomediastinal silhouette is borderline enlarged. No acute osseous abnormalities are seen.  IMPRESSION: Lungs hypoexpanded. Vascular congestion and borderline cardiomegaly, with question of minimal interstitial edema.   Electronically Signed   By: Roanna Raider M.D.   On: 09/19/2013 00:16    Microbiology: Recent Results (from the past 240 hour(s))  URINE CULTURE     Status: None   Collection Time    09/19/13 12:12 AM      Result Value Range Status   Specimen Description URINE, CATHETERIZED   Final   Special Requests NONE   Final   Culture  Setup Time     Final   Value: 09/19/2013 14:29     Performed at [facility] Count     Final   Value: >=100,000 COLONIES/ML     Performed at [facility]   Culture     Final   Value: GROUP B STREP(S.AGALACTIAE)ISOLATED     Note: TESTING AGAINST S. AGALACTIAE NOT ROUTINELY PERFORMED DUE TO PREDICTABILITY OF AMP/PEN/VAN SUSCEPTIBILITY.     Performed at [facility]   Report Status 09/20/2013 FINAL   Final  CULTURE, BLOOD (ROUTINE X 2)      Status: None   Collection Time    09/19/13  1:11 AM      Result Value Range Status   Specimen Description Blood   Final   Special Requests Normal   Final   Culture  Setup Time     Final   Value: 09/20/2013 22:04     Performed at [facility]   Culture     Final   Value: STAPHYLOCOCCUS AUREUS     Note: SUSCEPTIBILITIES PERFORMED ON PREVIOUS CULTURE WITHIN THE LAST 5 DAYS.     Note: Gram Stain Report Called to,Read Back By and Verified With: B.LEE AT 2316 ON 09/19/13 BY S.VANHOORNE Performed at [facility]     Performed at [facility]   Report Status 09/22/2013 FINAL   Final  CULTURE, BLOOD (ROUTINE X 2)     Status: None   Collection Time  09/19/13  1:13 AM      Result Value Range Status   Specimen Description Blood LEFT HAND   Final   Special Requests BOTTLES DRAWN AEROBIC AND ANAEROBIC 6 CC EACH   Final   Culture  Setup Time     Final   Value: 09/20/2013 21:35     Performed at [facility]   Culture     Final   Value: STAPHYLOCOCCUS AUREUS     Note: RIFAMPIN AND GENTAMICIN SHOULD NOT BE USED AS SINGLE DRUGS FOR TREATMENT OF STAPH INFECTIONS.     Note: Gram Stain Report Called to,Read Back By and Verified With: D.BREWER RN AT 2000 ON 09/19/13 BY S.VANHOORNE Performed at [facility]     Performed at [facility]   Report Status 09/22/2013 FINAL   Final   Organism ID, Bacteria STAPHYLOCOCCUS AUREUS   Final  MRSA PCR SCREENING     Status: None   Collection Time    09/19/13  1:28 PM      Result Value Range Status   MRSA by PCR NEGATIVE  NEGATIVE Final   Comment:            The GeneXpert MRSA Assay (FDA     approved for NASAL specimens     only), is one component of a     comprehensive MRSA colonization     surveillance program. It is not     intended to diagnose MRSA     infection nor to guide or     monitor treatment for     MRSA infections.  CLOSTRIDIUM DIFFICILE BY PCR     Status: None   Collection Time    09/19/13  4:55  PM      Result Value Range Status   C difficile by pcr NEGATIVE  NEGATIVE Final  CULTURE, BLOOD (ROUTINE X 2)     Status: None   Collection Time    09/21/13 12:15 PM      Result Value Range Status   Specimen Description BLOOD LEFT HAND   Final   Special Requests BOTTLES DRAWN AEROBIC AND ANAEROBIC 8CC EACH   Final   Culture NO GROWTH 3 DAYS   Final   Report Status PENDING   Incomplete  CULTURE, BLOOD (ROUTINE X 2)     Status: None   Collection Time    09/21/13 12:15 PM      Result Value Range Status   Specimen Description BLOOD RIGHT HAND   Final   Special Requests BOTTLES DRAWN AEROBIC AND ANAEROBIC 10CC EACH   Final   Culture NO GROWTH 3 DAYS   Final   Report Status PENDING   Incomplete     Labs: Basic Metabolic Panel:  Recent Labs Lab 09/18/13 2322 09/19/13 0648 09/19/13 1230 09/20/13 0515 09/21/13 0547 09/24/13 0456  NA 141  --  140 139 136 139  K 3.7  --  3.4* 3.8 3.7 3.6*  CL 107  --  109 111 107 105  CO2 22  --  19 19 21 23   GLUCOSE 164*  --  69* 184* 179* 257*  BUN 15  --  18 22 13 8   CREATININE 1.03 1.21 1.22 1.13 0.82 0.74  CALCIUM 8.3*  --  7.5* 7.0* 7.2* 7.8*   Liver Function Tests:  Recent Labs Lab 09/18/13 2322 09/20/13 0515  AST 34 31  ALT 35 27  ALKPHOS 163* 61  BILITOT 2.0* 1.9*  PROT 6.2 5.0*  ALBUMIN 2.8*  2.1*    Recent Labs Lab 09/19/13 0649 09/20/13 1009  AMMONIA 81* 45   CBC:  Recent Labs Lab 09/18/13 2322 09/19/13 0648 09/20/13 0515 09/21/13 0547 09/24/13 0456  WBC 4.8 5.1 3.1* 2.6* 1.9*  NEUTROABS 4.2  --   --   --   --   HGB 13.7 12.8* 11.1* 10.9* 10.8*  HCT 38.7* 36.8* 31.9* 31.3* 30.1*  MCV 89.4 90.4 91.7 91.0 88.5  PLT 31* 25* 23* 25* 27*   Cardiac Enzymes:  Recent Labs Lab 09/18/13 2350  TROPONINI <0.30   BNP: BNP (last 3 results) No results found for this basename: PROBNP,  in the last 8760 hours CBG:  Recent Labs Lab 09/23/13 1140 09/23/13 1650 09/23/13 2135 09/24/13 0737 09/24/13 1250   GLUCAP 330* 188* 246* 247* 317*    Principal Problem:   Staphylococcus aureus bacteremia Active Problems:   Sepsis   UTI (lower urinary tract infection)   Lethargy   Diabetes mellitus, type 2   Essential hypertension, benign   Hypotension, unspecified   Encephalopathy, hepatic   Pancytopenia   Cirrhosis of liver   Obesity   Time coordinating discharge: 35 minutes  Signed:  Brendia Sacks, MD [facility] 09/24/2013, 4:28 PM

## 2013-09-24 NOTE — Progress Notes (Signed)
[facility] PROGRESS NOTE  Caleb Riley UJW:[id number] DOB: [birth year] DOA: 09/18/2013 PCP: No primary provider on file. Diabetes is managed by Dr. Renaee Munda in [place]. Gastroenterologist: Dr. Diamantina Monks in [place] [phone]  Assessment/Plan: 1. Sepsis with MSSA bacteremia. Clinically stable. Ancef per infectious disease for 6 weeks. 2-D echocardiogram no evidence of vegetation. TEE felt to be too risky in light of his advanced liver disease, thrombocytopenia and esophageal varices. Source is unclear. No history of skin infection or implanted hardware. As infectious disease followup next month. 2. UTI, GBS: Treated. 3. Hepatic encephalopathy, resolved. Continue Xifaxan,  lactulose. 4. 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 Continue propranolol. Appears stable. 5. Diabetes mellitus, resume NPH/regular on discharge 6. Pancytopenia, presumably secondary to alcohol and cirrhosis, stable 7. Hepatitis A, followup with GI as an outpatient. Discussed with GI--likely reflects previous infection, no further evaluation indicated   Cefazolin monotherapy per ID for 4-6 weeks, minimum through 10/19/13.  Has follow-up with ID Dr. Daiva Eves 10/14/13  Discharge home today with [facility]  Fax records to patient's GI physician  Pending studies:   Blood cultures  Code Status: full code DVT prophylaxis: SCDs Family Communication: Discussed with her granddaughter about Disposition Plan: Home when improved  Brendia Sacks, MD  [facility]  Pager [phone] If 7PM-7AM, please contact night-coverage at [email or web address], password [facility] 09/24/2013, 4:08 PM  LOS: 6 days   Summary: 62 year old man presented with lethargy and high fever (greater than 104), hypotensive and encephalopathic. He is admitted for sepsis, UTI, decompensated cirrhosis.  Consultants:  ID per [facility] program (remote only) occupational therapy,  no followup needed    Procedures:  2-D echocardiogram: Left ventricular ejection fraction 60-65%. Normal wall motion. No evidence of vegetation.  Antibiotics:  Cefazolin 12/28 >> 4-6 weeks  Vancomycin 12/27 >> 12/29  HPI/Subjective: Complaints. Feeling well. No pain. Objective: Filed Vitals:   09/24/13 0528 09/24/13 0529 09/24/13 0530 09/24/13 0859  BP: 152/70 148/68 142/74 144/82  Pulse: 69 78 74   Temp:      TempSrc:      Resp:      Height:      Weight:      SpO2:        Intake/Output Summary (Last 24 hours) at 09/24/13 1608 Last data filed at 09/24/13 1403  Gross per 24 hour  Intake    260 ml  Output      0 ml  Net    260 ml     Filed Weights   09/22/13 0515 09/23/13 0500 09/24/13 0500  Weight: 113.445 kg (250 lb 1.6 oz) 109.2 kg (240 lb 11.9 oz) 110.496 kg (243 lb 9.6 oz)    Exam:   Afebrile, vital signs stable. No hypoxia.  General: Appears calm and comfortable.  Cardiovascular: Regular rate and rhythm. No murmur, rub or gallop.  Respiratory: Clear to auscultation bilaterally. No wheezes, rales rhonchi. Normal respiratory effort.  Abdomen: Soft, nontender, nondistended.  Data Reviewed:  Capillary blood sugars stable  Basic metabolic panel unremarkable  Pancytopenia without significant change  Scheduled Meds: .  ceFAZolin (ANCEF) IV  2 g Intravenous Q8H  . insulin aspart  0-20 Units Subcutaneous TID WC  . insulin aspart  0-5 Units Subcutaneous QHS  . insulin detemir  25 Units Subcutaneous Daily  . lactulose  20 g Oral Daily  . propranolol  20 mg Oral BID  . rifaximin  550 mg Oral BID  . sodium chloride  3 mL Intravenous Q12H  .  tamsulosin  0.4 mg Oral QPC supper   Continuous Infusions:   Principal Problem:   Staphylococcus aureus bacteremia Active Problems:   Sepsis   UTI (lower urinary tract infection)   Lethargy   Diabetes mellitus, type 2   Essential hypertension, benign   Hypotension, unspecified   Encephalopathy, hepatic    Pancytopenia   Cirrhosis of liver   Obesity

## 2013-09-24 NOTE — Discharge Planning (Signed)
Pt stated he was ready to be [facility] and had no pain.  His IV was removed.  PIcc was left in place per orders to have [facility] come in and infuse antibiotics and home.  Pt did get his 1400 dose of Ancef in hospital before DC.  He was told of FU appointments, and given script for propanolol.  Pt was also educated with family in room about safe PICC care and the importance of good hygiene.  Pt will be wheeled to car by RN and family when ready.

## 2013-09-26 LAB — CULTURE, BLOOD (ROUTINE X 2)
Culture: NO GROWTH
Culture: NO GROWTH

## 2013-09-29 NOTE — Progress Notes (Signed)
UR chart review completed.  

## 2013-09-30 ENCOUNTER — Telehealth: Payer: Self-pay | Admitting: Licensed Clinical Social Worker

## 2013-09-30 NOTE — Telephone Encounter (Signed)
He has an appt on 1/20

## 2013-09-30 NOTE — Telephone Encounter (Signed)
[facility] appear to be where this pt "lives with re to his labs. NOTe i have NEVER personally seen this pt. Dr Megan Salon and Dr. Linus Salmons saw him over the break. Is his FU with me?

## 2013-09-30 NOTE — Telephone Encounter (Signed)
Judson Roch, RN from [facility] called in about critical labs for this patient. She reports that WBC -1.9 platelets-35. Fax came in this morning and placed in providers box to be viewed.

## 2013-10-07 ENCOUNTER — Telehealth: Payer: Self-pay | Admitting: *Deleted

## 2013-10-07 NOTE — Telephone Encounter (Signed)
Thanks, not new, nothing to do (cirrhosis)

## 2013-10-07 NOTE — Telephone Encounter (Signed)
[facility] appt 10/14/13 @ 2:00PM w/ Dr Tommy Medal.  Will send message to Dr. Scharlene Gloss for review.

## 2013-10-14 ENCOUNTER — Ambulatory Visit (INDEPENDENT_AMBULATORY_CARE_PROVIDER_SITE_OTHER): Payer: Medicare Other | Admitting: Infectious Disease

## 2013-10-14 ENCOUNTER — Encounter: Payer: Self-pay | Admitting: Infectious Disease

## 2013-10-14 VITALS — BP 150/73 | HR 66 | Temp 98.6°F | Wt 233.0 lb

## 2013-10-14 DIAGNOSIS — E1159 Type 2 diabetes mellitus with other circulatory complications: Secondary | ICD-10-CM

## 2013-10-14 DIAGNOSIS — E1151 Type 2 diabetes mellitus with diabetic peripheral angiopathy without gangrene: Secondary | ICD-10-CM

## 2013-10-14 DIAGNOSIS — R7881 Bacteremia: Secondary | ICD-10-CM

## 2013-10-14 DIAGNOSIS — Z23 Encounter for immunization: Secondary | ICD-10-CM

## 2013-10-14 DIAGNOSIS — A4901 Methicillin susceptible Staphylococcus aureus infection, unspecified site: Secondary | ICD-10-CM

## 2013-10-14 DIAGNOSIS — Z945 Skin transplant status: Secondary | ICD-10-CM

## 2013-10-14 DIAGNOSIS — Z9889 Other specified postprocedural states: Secondary | ICD-10-CM

## 2013-10-14 DIAGNOSIS — E1059 Type 1 diabetes mellitus with other circulatory complications: Secondary | ICD-10-CM

## 2013-10-14 NOTE — Progress Notes (Signed)
Subjective:    Patient ID: Caleb Riley, male    DOB: December 04, 1950, 63 y.o.   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 The source of his bacteremia was not clear. Did not have any clear-cut ulcers or cellulitis. He underwent transthoracic echocardiogram failed to show any evidence of vegetations. He did not undergo transesophageal echocardiogram. His repeat blood cultures were negative and he was narrowed from vancomycin and cefazolin to cefazolin alone 2 g IV every 8 hours he is currently into his fourth week of therapy.  He has no shoulder pain lower back pain or pain or sores and no evidence of metastatic spread of his staph aureus infection. He feels well and is tolerating his antibiotics very well he had one day of loose stools but otherwise no other problems of diarrhea.    Review of Systems  Constitutional: Negative for fever, chills, diaphoresis, activity change, appetite change, fatigue and unexpected weight change.  HENT: Negative for congestion, rhinorrhea, sinus pressure, sneezing, sore throat and trouble swallowing.   Eyes: Negative for photophobia and visual disturbance.  Respiratory: Negative for cough, chest tightness, shortness of breath, wheezing and stridor.   Cardiovascular: Negative for chest pain, palpitations and leg swelling.  Gastrointestinal: Negative for nausea, vomiting, abdominal pain, diarrhea, constipation, blood in stool, abdominal distention and anal bleeding.  Genitourinary: Negative for dysuria, hematuria, flank pain and difficulty urinating.  Musculoskeletal: Negative for arthralgias, back pain, gait problem, joint swelling and myalgias.  Skin: Negative for color change, pallor, rash and wound.  Neurological:  Negative for dizziness, tremors, weakness and light-headedness.  Hematological: Negative for adenopathy. Does not bruise/bleed easily.  Psychiatric/Behavioral: Negative for behavioral problems, confusion, sleep disturbance, dysphoric mood, decreased concentration and agitation.       Objective:   Physical Exam  Constitutional: He is oriented to person, place, and time. He appears well-developed and well-nourished. No distress.  HENT:  Head: Normocephalic and atraumatic.  Mouth/Throat: Oropharynx is clear and moist. No oropharyngeal exudate.  Eyes: Conjunctivae and EOM are normal. Pupils are equal, round, and reactive to light. No scleral icterus.  Neck: Normal range of motion. Neck supple. No JVD present.  Cardiovascular: Normal rate, regular rhythm and normal heart sounds.  Exam reveals no gallop and no friction rub.   No murmur heard. Pulmonary/Chest: Effort normal and breath sounds normal. No respiratory distress. He has no wheezes. He has no rales. He exhibits no tenderness.  Abdominal: He exhibits no distension and no mass. There is no tenderness. There is no rebound and no guarding.  Musculoskeletal: He exhibits no edema and no tenderness.  Lymphadenopathy:    He has no cervical adenopathy.  Neurological: He is alert and oriented to person, place, and time. He exhibits normal muscle tone. Coordination normal.  Skin: Skin is warm and dry. He is not diaphoretic. No erythema. No pallor.     Psychiatric: He has a normal mood and affect. His behavior is normal. Judgment and thought content normal.    PICC is clean dry and intact no erythema or drainage    Right foot no ulcers partial bowel splinter hemorrhage and toenail     Left foot with onychomycosis big toenail    Left leg where he has had prior surgery no evidence of infection  Assessment & Plan:   Methicillin sensitive staph aureus bacteremia: While his transfer thoracic echocardiogram was negative for  vegetation there is concern that he could have had undiagnosed endocarditis. He does have a possible splinter hemorrhages toenail which likely is there before.  Will continue complete a six-week course of cefazolin 2 g every 8 hours.  We'll bring him back 2 weeks after finishing therapy to check surveillance cultures.  Will consider a decolonization regimen.  I spent greater than 45 minutes with the patient including greater than 50% of time in face to face counsel of the patient and in coordination of their care.   Cirrhosis of liver: Patient is obtained in alcohol he is hepatitis C negative no other viral hepatitides would be regular due to vaccinated against the baby.  Diabetes mellitus following with technologist. Patient and daughter requests referral for primary care which if try to accommodate

## 2013-10-14 NOTE — Patient Instructions (Signed)
Continue antibiotics until February the 7th  Then [facility] can pull PICCline  RTC to see ID MD here in our clinic week of Feb 23rd

## 2013-10-29 ENCOUNTER — Telehealth: Payer: Self-pay

## 2013-10-29 NOTE — Telephone Encounter (Signed)
That is ridiculous his RNA was negative. I can still do HIV ab. I frequently do the RNA test so I dont have to mess with the lab taking a week to confirm or deny EIA result esp in low prob pt

## 2013-10-29 NOTE — Telephone Encounter (Signed)
DIS worker came by today regarding HIV RNA done at [facility].  This result was reported to [facility] with no other labs done. When the patient was visited he told them he comes to [facility] and they were not sure if he was HIV positive. This is not one of our HIV positive patients. We are seeing him form staph infection and other issues.  DIS would like [place] to test patient for HIV antibody with reflex at next office visit    RNA : <20 on 09-21-13 [facility] .   Laverle Patter, RN

## 2013-11-17 ENCOUNTER — Ambulatory Visit (INDEPENDENT_AMBULATORY_CARE_PROVIDER_SITE_OTHER): Payer: Medicare Other | Admitting: Infectious Diseases

## 2013-11-17 VITALS — BP 152/84 | HR 54 | Temp 98.2°F | Ht 68.0 in | Wt 232.0 lb

## 2013-11-17 DIAGNOSIS — A4901 Methicillin susceptible Staphylococcus aureus infection, unspecified site: Secondary | ICD-10-CM

## 2013-11-17 DIAGNOSIS — B9561 Methicillin susceptible Staphylococcus aureus infection as the cause of diseases classified elsewhere: Secondary | ICD-10-CM

## 2013-11-17 DIAGNOSIS — R7881 Bacteremia: Secondary | ICD-10-CM

## 2013-11-17 DIAGNOSIS — E119 Type 2 diabetes mellitus without complications: Secondary | ICD-10-CM

## 2013-11-17 MED ORDER — MUPIROCIN 2 % EX OINT: 1.0000 "application " | TOPICAL_OINTMENT | Freq: Two times a day (BID) | CUTANEOUS | Status: DC

## 2013-11-17 NOTE — Assessment & Plan Note (Signed)
His FSG are fairly well controlled. Has PCP appt to f/u.

## 2013-11-17 NOTE — Assessment & Plan Note (Signed)
Will recheck his BCx today. Explained to him and his daughter and try decolonization regimen with mupirocin. I cautioned them that this is not 100% effective.

## 2013-11-17 NOTE — Progress Notes (Signed)
   Subjective:    Patient ID: Caleb Riley, male    DOB: 1951/02/14, 63 y.o.   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 He was admitted to [facility] 09-18-13 with MSSA bacteremia. The source of his bacteremia was not clear. He underwent transthoracic echocardiogram failed to show any evidence of vegetations (no TEE). His repeat blood cultures were negative and he was narrowed from vancomycin and cefazolin to cefazolin alone 2 g IV every 8 hours. He completed his IV anbx on 11-01-13. His PIC was removed at that time as well. He has been feeling well. No fever or chills. No pain.  Has pending appt for PCP.  His FSG have been 69-160 in AM, usually 105-110.  No diabetic foot ulcers.   Review of Systems  Constitutional: Negative for fever, chills, appetite change and unexpected weight change.  Gastrointestinal: Negative for diarrhea and constipation.  Genitourinary: Negative for difficulty urinating.  Neurological: Negative for numbness.       Objective:   Physical Exam  Constitutional: He appears well-developed and well-nourished.  HENT:  Mouth/Throat: No oropharyngeal exudate.  Eyes: EOM are normal. Pupils are equal, round, and reactive to light.  Cardiovascular: Normal rate, regular rhythm and normal heart sounds.   No murmur heard. Pulmonary/Chest: Effort normal and breath sounds normal.  Abdominal: Soft. Bowel sounds are normal. There is no tenderness.          Assessment & Plan:

## 2013-11-18 ENCOUNTER — Encounter: Payer: Self-pay | Admitting: Infectious Disease

## 2013-11-23 LAB — CULTURE, BLOOD (SINGLE)
ORGANISM ID, BACTERIA: NO GROWTH
ORGANISM ID, BACTERIA: NO GROWTH

## 2013-11-27 ENCOUNTER — Encounter: Payer: Self-pay | Admitting: Infectious Disease

## 2014-01-31 ENCOUNTER — Encounter (HOSPITAL_COMMUNITY): Payer: Self-pay | Admitting: Emergency Medicine

## 2014-01-31 ENCOUNTER — Emergency Department (HOSPITAL_COMMUNITY): Payer: Medicare Other

## 2014-01-31 ENCOUNTER — Emergency Department (HOSPITAL_COMMUNITY)
Admission: EM | Admit: 2014-01-31 | Discharge: 2014-01-31 | Disposition: A | Discharge status: Home / Self Care | Payer: Medicare Other | Attending: Emergency Medicine | Admitting: Emergency Medicine

## 2014-01-31 DIAGNOSIS — E1169 Type 2 diabetes mellitus with other specified complication: Secondary | ICD-10-CM | POA: Insufficient documentation

## 2014-01-31 DIAGNOSIS — R4182 Altered mental status, unspecified: Secondary | ICD-10-CM | POA: Insufficient documentation

## 2014-01-31 DIAGNOSIS — E162 Hypoglycemia, unspecified: Secondary | ICD-10-CM

## 2014-01-31 LAB — URINALYSIS, ROUTINE W REFLEX MICROSCOPIC
BILIRUBIN URINE: NEGATIVE
GLUCOSE, UA: NEGATIVE mg/dL
HGB URINE DIPSTICK: NEGATIVE
Ketones, ur: NEGATIVE mg/dL
Leukocytes, UA: NEGATIVE
Nitrite: NEGATIVE
Protein, ur: NEGATIVE mg/dL
Specific Gravity, Urine: >1.03 — ABNORMAL HIGH (ref 1.005–1.030)
UROBILINOGEN UA: 0.2 mg/dL (ref 0.0–1.0)
pH: 6 (ref 5.0–8.0)

## 2014-01-31 LAB — COMPREHENSIVE METABOLIC PANEL
ALT: 40 U/L (ref 0–53)
AST: 51 U/L — ABNORMAL HIGH (ref 0–37)
Albumin: 2.5 g/dL — ABNORMAL LOW (ref 3.5–5.2)
Alkaline Phosphatase: 154 U/L — ABNORMAL HIGH (ref 39–117)
BILIRUBIN TOTAL: 1.8 mg/dL — AB (ref 0.3–1.2)
BUN: 11 mg/dL (ref 6–23)
CHLORIDE: 112 meq/L (ref 96–112)
CO2: 24 meq/L (ref 19–32)
CREATININE: 0.94 mg/dL (ref 0.50–1.35)
Calcium: 8.1 mg/dL — ABNORMAL LOW (ref 8.4–10.5)
GFR, EST NON AFRICAN AMERICAN: 89 mL/min — AB (ref >90)
GLUCOSE: 27 mg/dL — AB (ref 70–99)
Potassium: 4.1 mEq/L (ref 3.7–5.3)
Sodium: 144 mEq/L (ref 137–147)
Total Protein: 6.4 g/dL (ref 6.0–8.3)

## 2014-01-31 LAB — CBC WITH DIFFERENTIAL/PLATELET
Basophils Absolute: 0 10*3/uL (ref 0.0–0.1)
Basophils Relative: 0 % (ref 0–1)
EOS PCT: 7 % — AB (ref 0–5)
Eosinophils Absolute: 0.4 10*3/uL (ref 0.0–0.7)
HCT: 38.6 % — ABNORMAL LOW (ref 39.0–52.0)
Hemoglobin: 14.1 g/dL (ref 13.0–17.0)
Lymphocytes Relative: 23 % (ref 12–46)
Lymphs Abs: 1.3 10*3/uL (ref 0.7–4.0)
MCH: 32 pg (ref 26.0–34.0)
MCHC: 36.5 g/dL — AB (ref 30.0–36.0)
MCV: 87.5 fL (ref 78.0–100.0)
Monocytes Absolute: 0.5 10*3/uL (ref 0.1–1.0)
Monocytes Relative: 9 % (ref 3–12)
Neutro Abs: 3.4 10*3/uL (ref 1.7–7.7)
Neutrophils Relative %: 62 % (ref 43–77)
Platelets: 81 10*3/uL — ABNORMAL LOW (ref 150–400)
RBC: 4.41 MIL/uL (ref 4.22–5.81)
RDW: 14.9 % (ref 11.5–15.5)
WBC: 5.5 10*3/uL (ref 4.0–10.5)

## 2014-01-31 LAB — CBG MONITORING, ED
GLUCOSE-CAPILLARY: 108 mg/dL — AB (ref 70–99)
GLUCOSE-CAPILLARY: 53 mg/dL — AB (ref 70–99)
GLUCOSE-CAPILLARY: 119 mg/dL — AB (ref 70–99)
Glucose-Capillary: 94 mg/dL (ref 70–99)

## 2014-01-31 LAB — ETHANOL: Alcohol, Ethyl (B): <11 mg/dL (ref 0–11)

## 2014-01-31 MED ORDER — GLUCOSE 4 G PO CHEW
1.0000 | CHEWABLE_TABLET | Freq: Once | ORAL | Status: AC
  Administered 2014-01-31: 4 g via ORAL

## 2014-01-31 MED ORDER — GLUCAGON HCL (RDNA) 1 MG IJ SOLR
INTRAMUSCULAR | Status: DC
Start: 2014-01-31 — End: 2014-01-31
  Filled 2014-01-31: qty 2

## 2014-01-31 MED ORDER — GLUCOSE-VITAMIN C 4-6 GM-MG PO CHEW
CHEWABLE_TABLET | ORAL | Status: AC
  Administered 2014-01-31: 4 g
  Filled 2014-01-31: qty 1

## 2014-01-31 MED ORDER — SODIUM CHLORIDE 0.9 % IV BOLUS (SEPSIS): 1000.0000 mL | Freq: Once | INTRAVENOUS | Status: DC

## 2014-01-31 MED ORDER — DEXTROSE 50 % IV SOLN
INTRAVENOUS | Status: AC
  Administered 2014-01-31: 21:00:00
  Filled 2014-01-31: qty 50

## 2014-01-31 NOTE — ED Notes (Signed)
Per EMS: pt was found on ground by friends, unknown how long down. Original CBG of 24 by [employer], given Glucagon by EMS at 8:40pm. CBG of 28 afterwards.

## 2014-01-31 NOTE — ED Notes (Signed)
Several attempts to place IV. None successful. EDP made aware.

## 2014-01-31 NOTE — ED Provider Notes (Signed)
CSN: [id number]     Arrival date & time 01/31/14  2057 History  This chart was scribed for Maudry Diego, MD by Roxan Diesel, ED scribe.  This patient was seen in room APA18/APA18 and the patient's care was started at 9:00 PM.   Chief Complaint  Patient presents with  . Hypoglycemia  . Altered Mental Status    Patient is a 63 y.o. male presenting with hypoglycemia. The history is provided by the EMS personnel and a friend. No language interpreter was used.  Hypoglycemia Initial blood sugar:  24 Blood sugar after intervention:  28 Severity:  Severe Timing:  Unable to specify Progression:  Unchanged Diabetic status:  Controlled with insulin Ineffective treatments:  Glucagon Associated symptoms: altered mental status     Level 5 Caveat: AMS  HPI Comments: Caleb Riley is a 63 y.o. male brought in by EMS to the Emergency Department for hypoglycemia and AMS.  Per EMS, pt was found lying on the ground unresponsive by his friends at a cookout earlier tonight, with unknown down time.  On EMS arrival CBG was 24.  He was given Glucagon by EMS at 8:40 PM which raised his CBG only to 28.  Pt states he does not remember what happened prior to arrival.  He denies pain to any area currently.  He denies SOB or weakness.  He does admit to h/o DM and states he gives himself insulin injections.  He denies regular medication usage for heart or lung problems.  Daughter states pt was admitted in December for a UTI and developed sepsis and ascites.     Past Medical History  Diagnosis Date  . Diabetes mellitus without complication     No past surgical history on file.  No family history on file.   History  Substance Use Topics  . Smoking status: Not on file  . Smokeless tobacco: Not on file  . Alcohol Use: Not on file     Review of Systems  Unable to perform ROS: Mental status change      Allergies  Review of patient's allergies indicates no known allergies.  Home Medications    Prior to Admission medications   Not on File   BP 157/89  Pulse 63  Resp 34  SpO2 98%  Physical Exam  Nursing note and vitals reviewed. Constitutional: He appears well-developed.  Lethargic  HENT:  Head: Normocephalic.  Eyes: Conjunctivae are normal. No scleral icterus.  Neck: Neck supple. No thyromegaly present.  Cardiovascular: Normal rate.   Pulmonary/Chest: Effort normal. No stridor. No respiratory distress.  Musculoskeletal: Normal range of motion. He exhibits no edema.  Lymphadenopathy:    He has no cervical adenopathy.  Neurological: He exhibits normal muscle tone. Coordination normal.  Oriented only to person  Skin: No rash noted. No erythema.    ED Course  Procedures (including critical care time)  DIAGNOSTIC STUDIES: Oxygen Saturation is 98% on room air, normal by my interpretation.    COORDINATION OF CARE: 9:10 PM-Communication limited by condition of patient.  Will order glucose chewable, dextrose solution, and labs.    Labs Review Labs Reviewed  CBC WITH DIFFERENTIAL - Abnormal; Notable for the following:    HCT 38.6 (*)    MCHC 36.5 (*)    Platelets 81 (*)    Eosinophils Relative 7 (*)    All other components within normal limits  COMPREHENSIVE METABOLIC PANEL - Abnormal; Notable for the following:    Glucose, Bld 27 (*)    Calcium  8.1 (*)    Albumin 2.5 (*)    AST 51 (*)    Alkaline Phosphatase 154 (*)    Total Bilirubin 1.8 (*)    GFR calc non Af Amer 89 (*)    All other components within normal limits  CBG MONITORING, ED - Abnormal; Notable for the following:    Glucose-Capillary 108 (*)    All other components within normal limits  CBG MONITORING, ED - Abnormal; Notable for the following:    Glucose-Capillary 53 (*)    All other components within normal limits  ETHANOL    Imaging Review No results found.   EKG Interpretation None    pt mental status normal now.  He states he did not eat after breakfast today  MDM   Final  diagnoses:  None    The chart was scribed for me under my direct supervision.  I personally performed the history, physical, and medical decision making and all procedures in the evaluation of this patient.Maudry Diego, MD 01/31/14 [phone]

## 2014-01-31 NOTE — Discharge Instructions (Signed)
Follow up with your md next week.  Make sure you eat your meals when taking insulin

## 2014-01-31 NOTE — ED Notes (Signed)
Pt given orange juice mixed with sugar.

## 2014-01-31 NOTE — ED Notes (Signed)
Pt alert and orientated. Pt's friends at bedside, states that they just met him today, pt was at a gathering today when he was found lying on the ground. Pt friends state they have called the daughter [place] [phone]).

## 2014-01-31 NOTE — ED Notes (Signed)
Pt given Healthy Choice meal tray with second glass of orange juice mixed with sugar.

## 2014-01-31 NOTE — ED Notes (Signed)
Pt's visitor stated daughter says he has history of cirrhosis of the liver and diabetes, was admitted on around 09/21/13 for UTI and sepsis.  Pt also consumed 100% of frozen dinner and 480CC of orange juice.

## 2014-01-31 NOTE — ED Notes (Signed)
Attempted to place IV numerous times w/o success, unable to give bolus fluids, EDP aware.

## 2014-02-02 ENCOUNTER — Encounter: Payer: Self-pay | Admitting: Infectious Disease

## 2015-11-12 IMAGING — CR DG CHEST 1V PORT
1 series · 1 of 1 positions shown · non-contrast
Comparison: None.

CLINICAL DATA: Shortness of breath and chest pain.

EXAM:
PORTABLE CHEST - 1 VIEW

[portable]
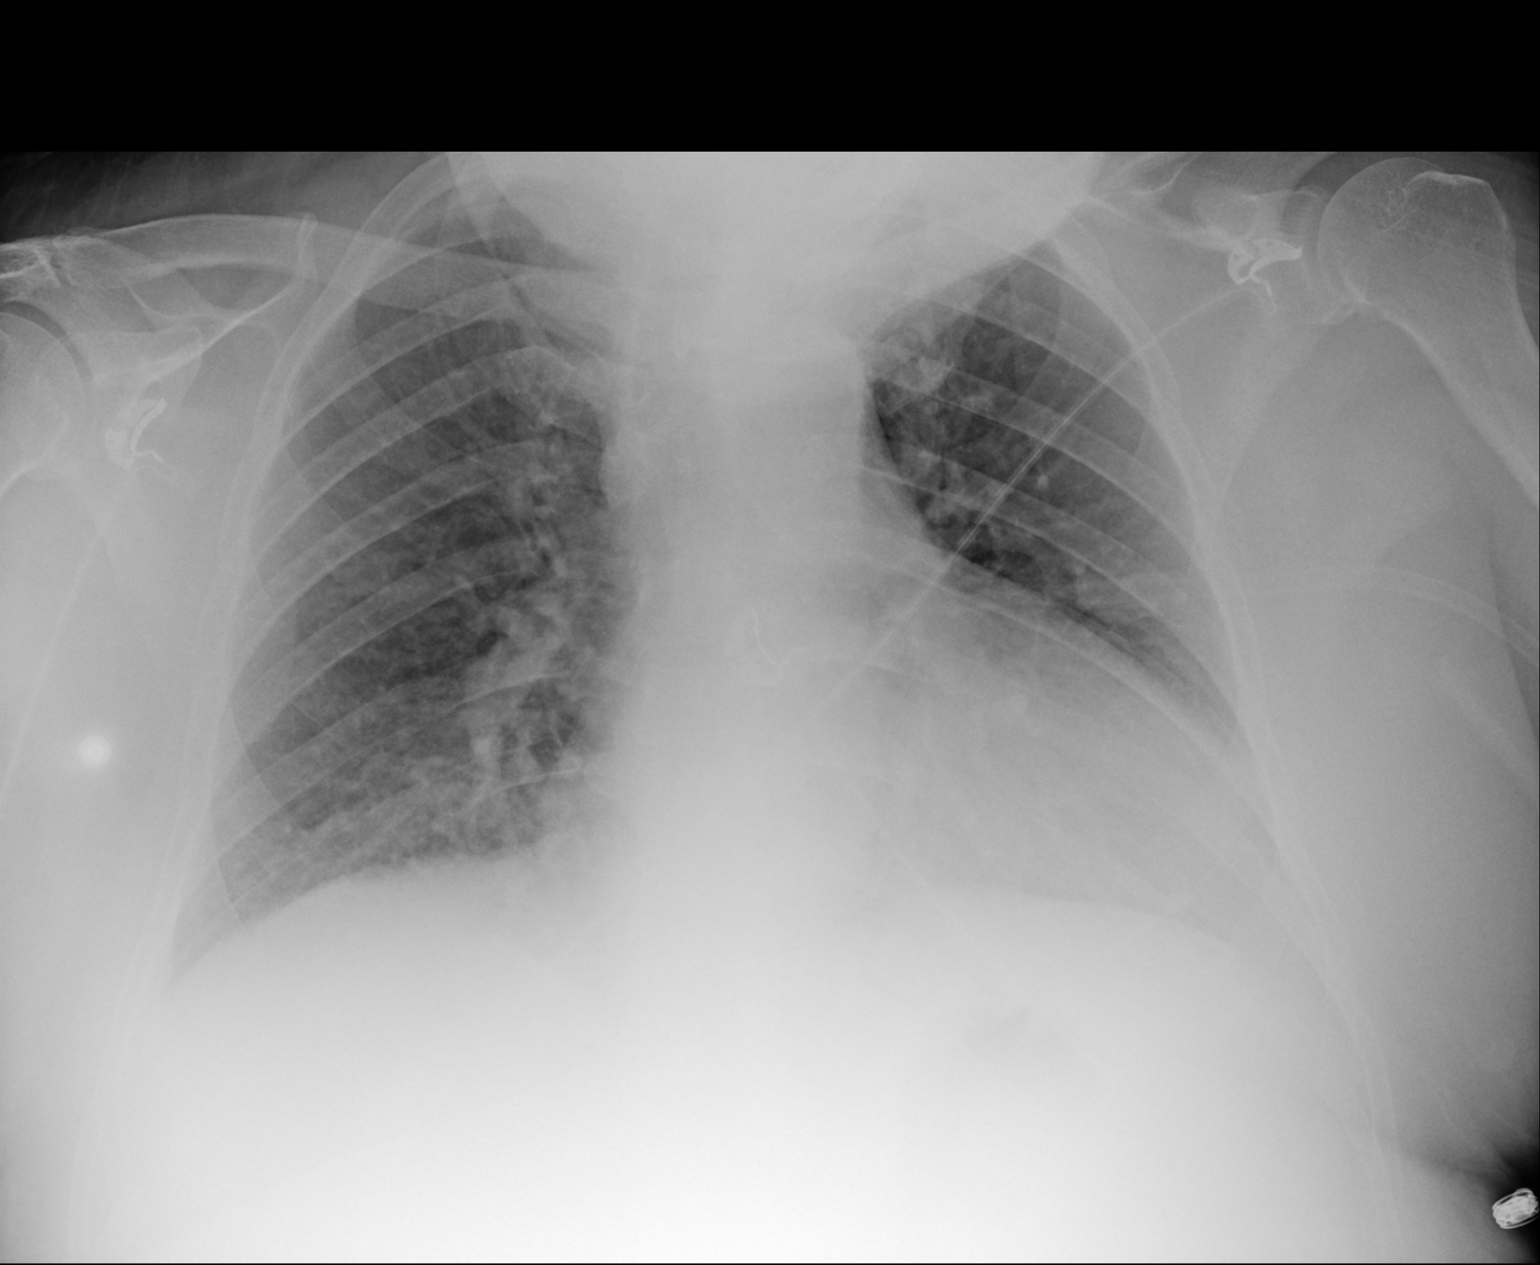

[1 of 1 positions shown; findings below may reference images not displayed]

FINDINGS: The lungs are hypoexpanded. Vascular congestion is noted, with
question of minimal interstitial edema. There is no evidence of
pleural effusion or pneumothorax.

The cardiomediastinal silhouette is borderline enlarged. No acute
osseous abnormalities are seen.
IMPRESSION: Lungs hypoexpanded. Vascular congestion and borderline cardiomegaly,
with question of minimal interstitial edema.

## 2015-11-12 IMAGING — CR DG CHEST 1V PORT
1 series · 1 of 1 positions shown · non-contrast
Comparison: [DATE]/2590 5599 hrs

CLINICAL DATA: Sepsis

EXAM:
PORTABLE CHEST - 1 VIEW

[portable]
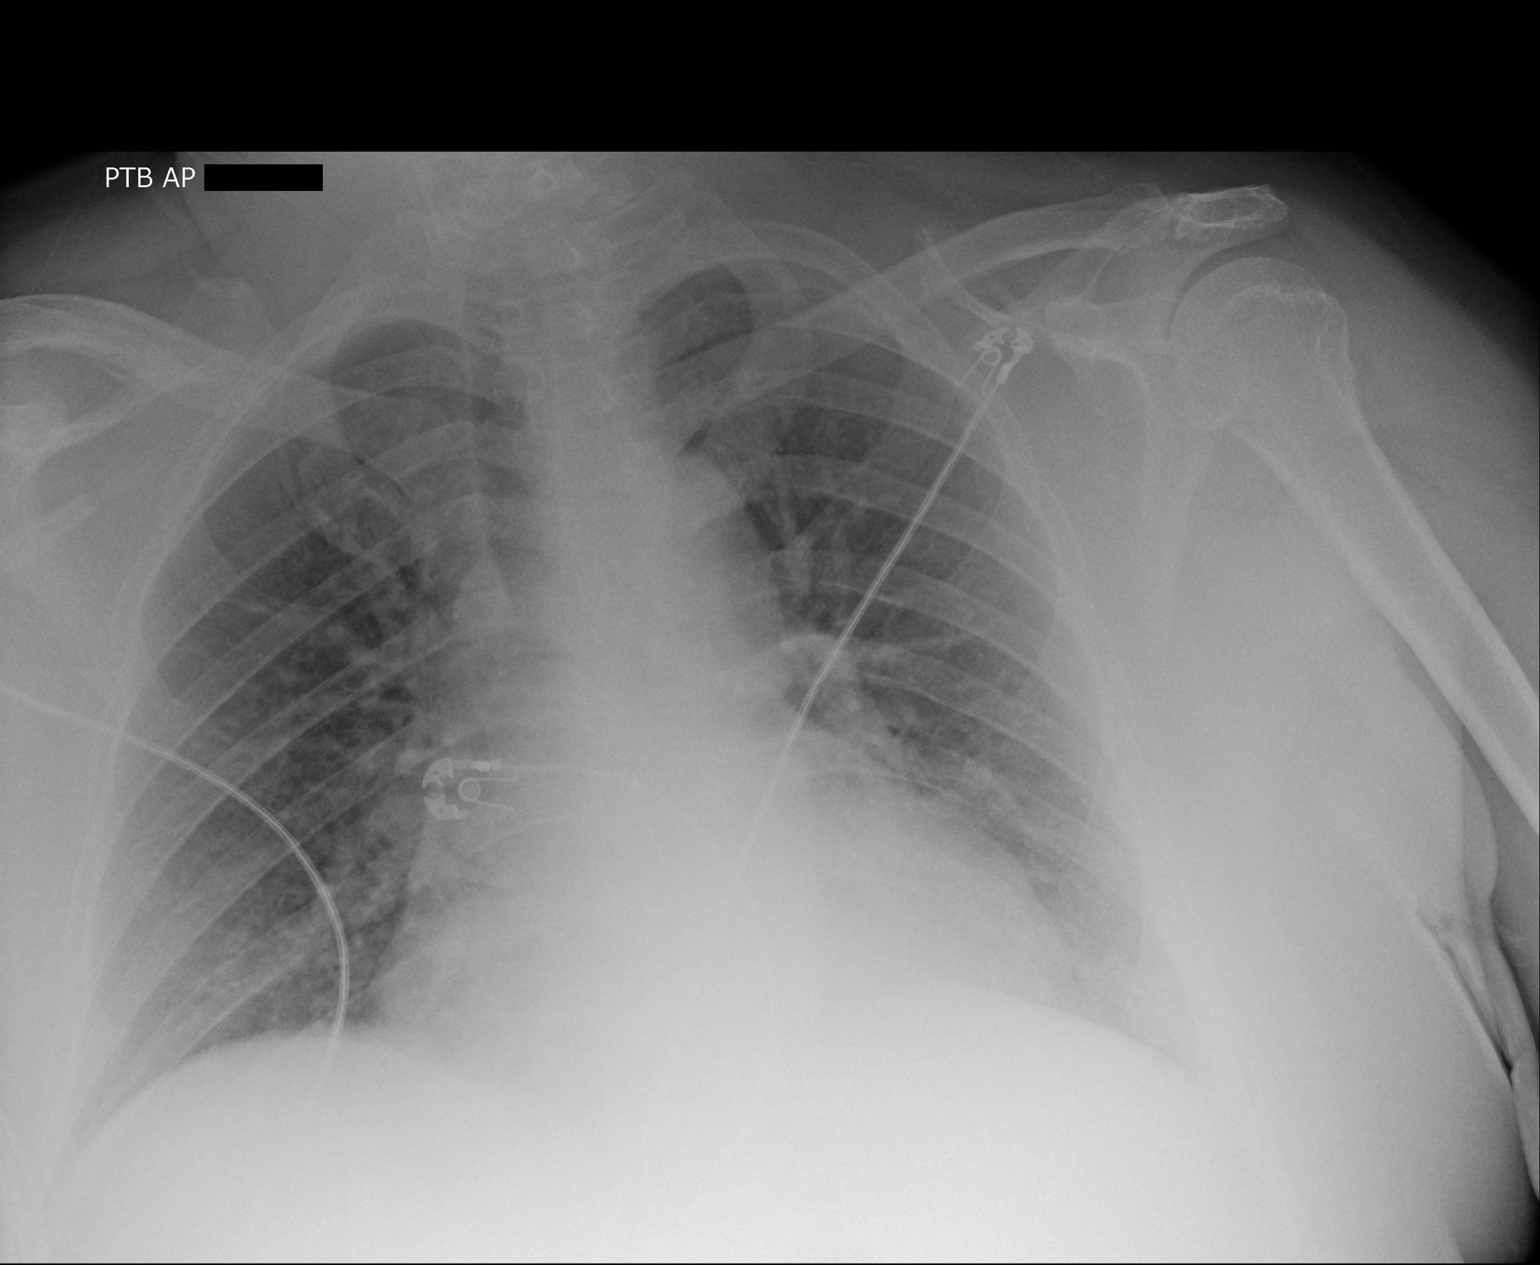

[1 of 1 positions shown; findings below may reference images not displayed]

FINDINGS: Cardiac shadow is again mildly enlarged. The lungs are well aerated
without focal infiltrate. Mild vascular congestion is again seen.
IMPRESSION: No change from the prior study.

## 2015-11-12 IMAGING — CT CT ABD-PELV W/ CM
2 of 4 series · 17 of 46 positions shown, 19 images · IV contrast (Omnipaque 300)
Comparison: None.

CLINICAL DATA: Pain.  Nausea.

EXAM:
CT ABDOMEN AND PELVIS WITH CONTRAST
TECHNIQUE: Multidetector CT imaging of the abdomen and pelvis was performed
using the standard protocol following bolus administration of
intravenous contrast.
CONTRAST:  50mL OMNIPAQUE IOHEXOL 300 MG/ML SOLN, 100mL OMNIPAQUE
IOHEXOL 300 MG/ML SOLN

[Series 2: abd_pel_with 5.0 b40f · axial · 0.94mm/px · z∈[-492,-46]mm · 14 of 99 slices shown, 16 images]
[im 5/99  soft-tissue]
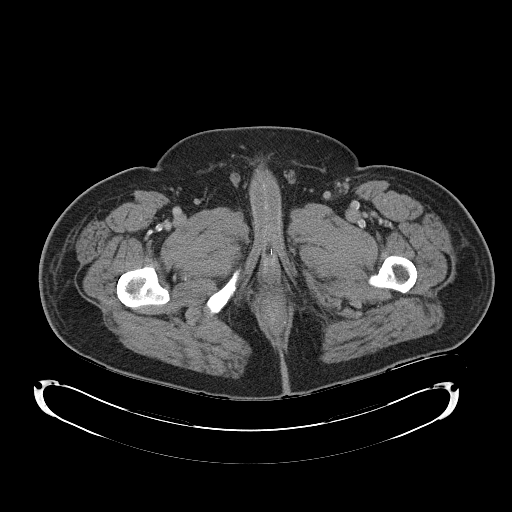
[im 5/99  bone]
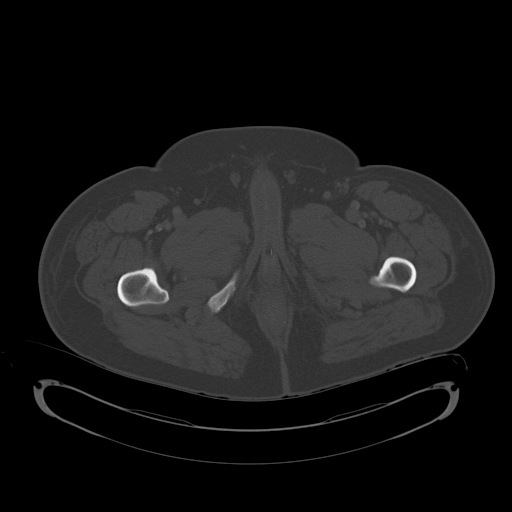
[im 15/99  soft-tissue]
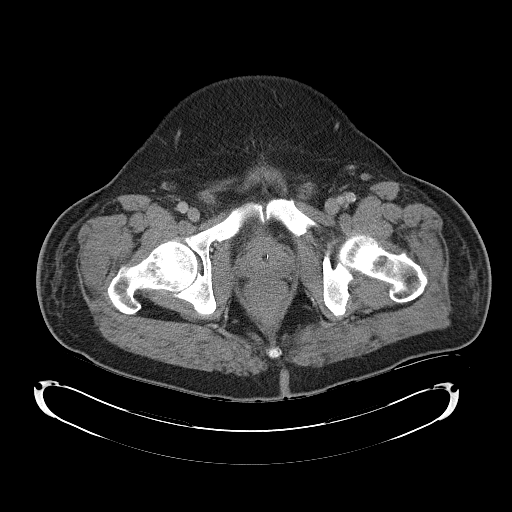
[im 20/99  soft-tissue]
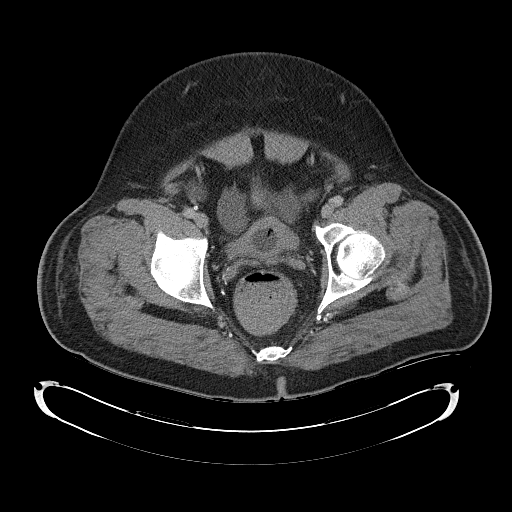
[im 25/99  soft-tissue]
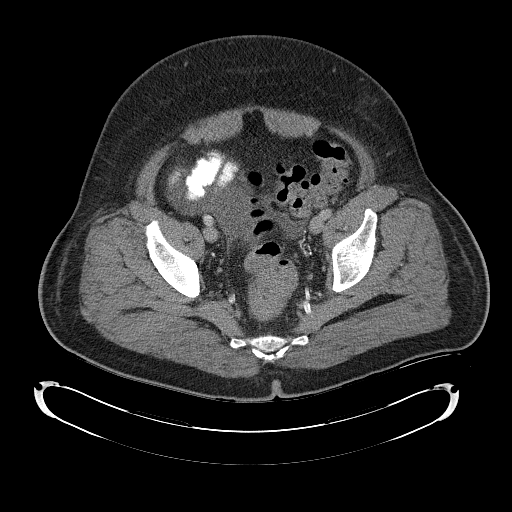
[im 35/99  soft-tissue]
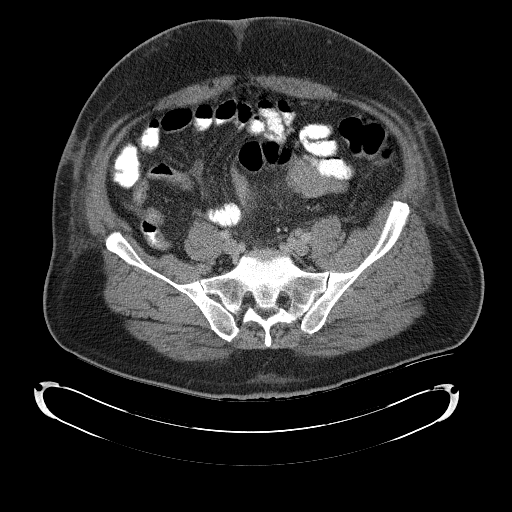
[im 40/99  soft-tissue]
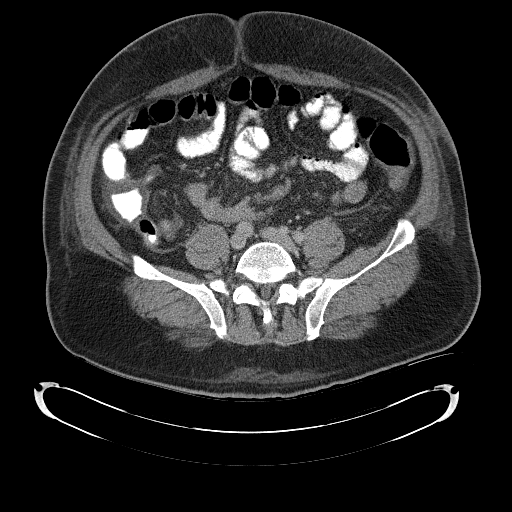
[im 45/99  soft-tissue]
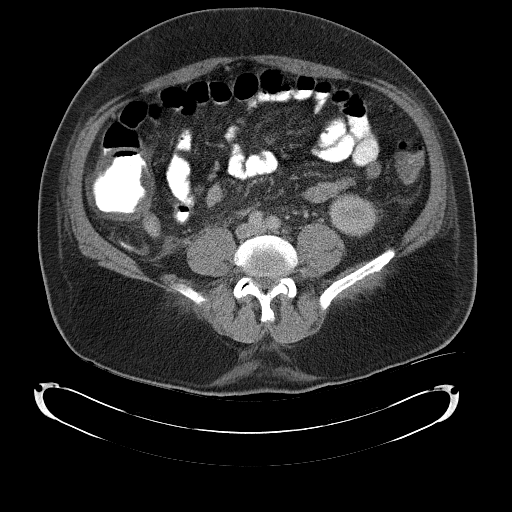
[im 54/99  soft-tissue]
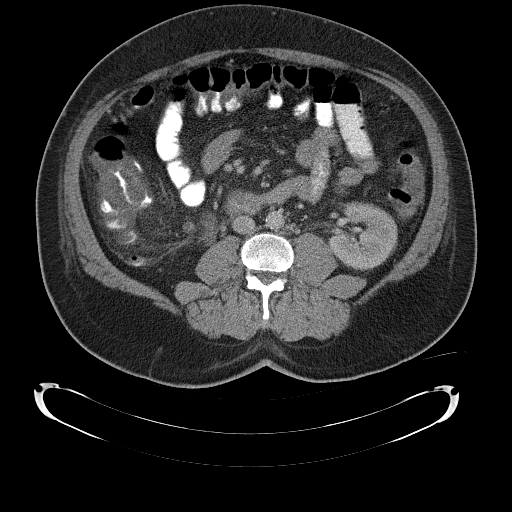
[im 59/99  soft-tissue]
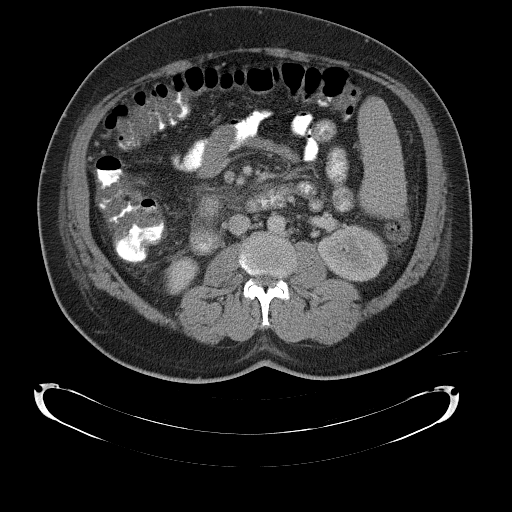
[im 59/99  bone]
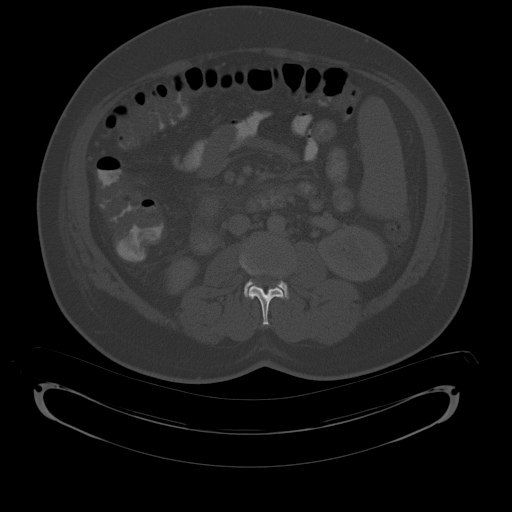
[im 64/99  soft-tissue]
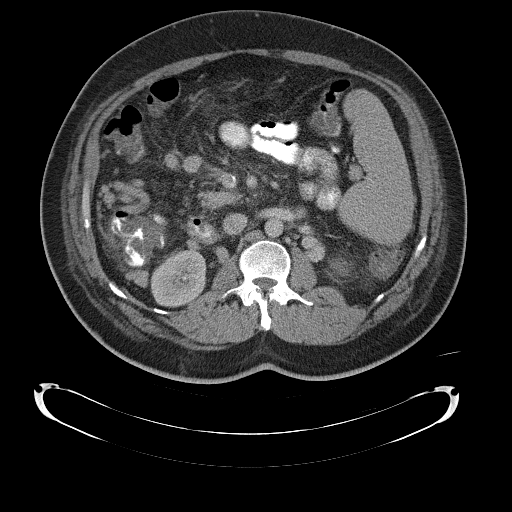
[im 74/99  soft-tissue]
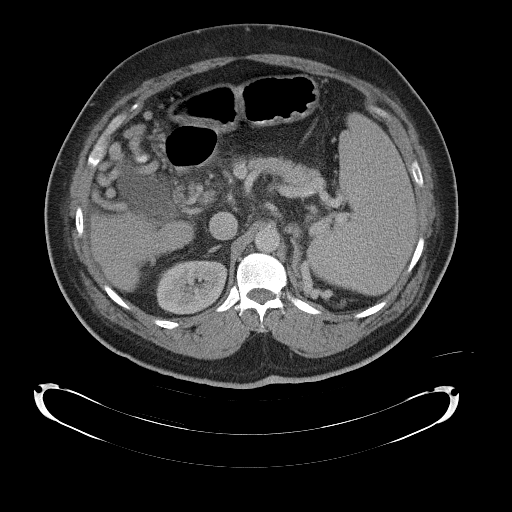
[im 79/99  soft-tissue]
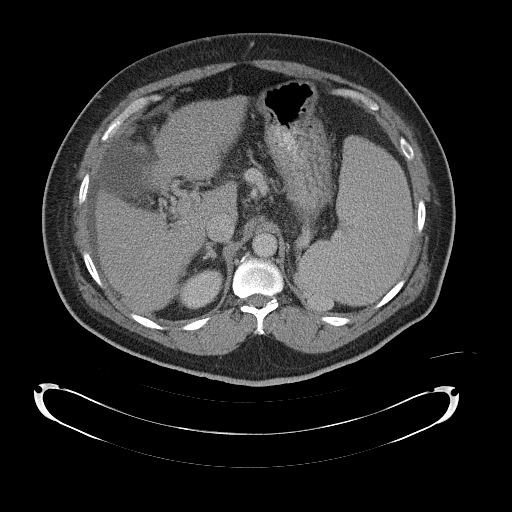
[im 84/99  soft-tissue]
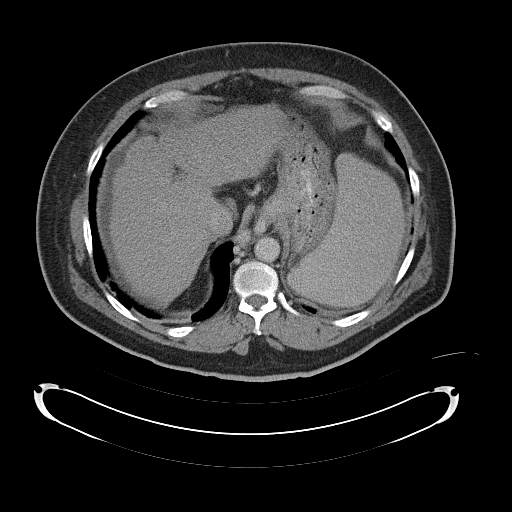
[im 94/99  soft-tissue]
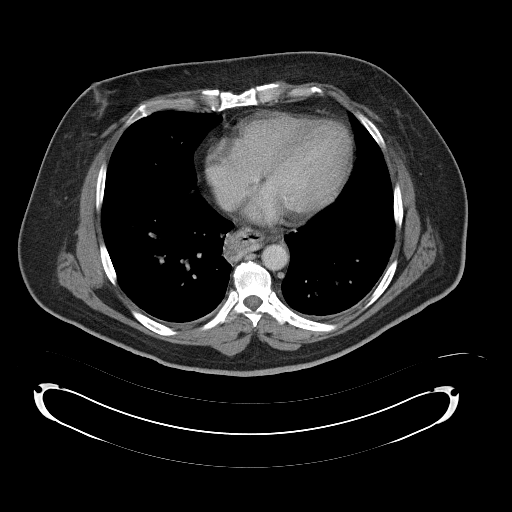

[Series 4: abd_pel_with 3.0 spo cor · coronal · 0.99mm/px · 3 of 107 slices shown]
[im 36/107  soft-tissue]
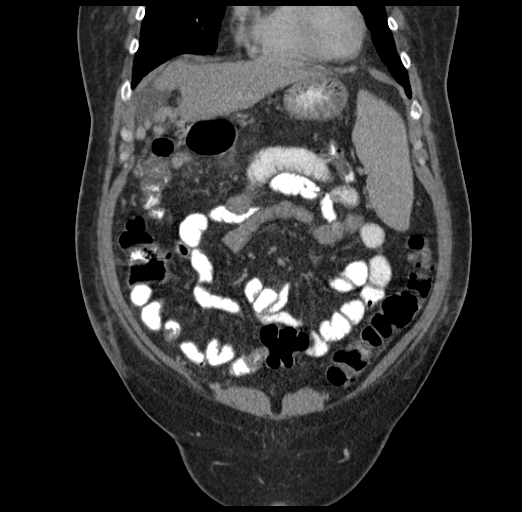
[im 48/107  soft-tissue]
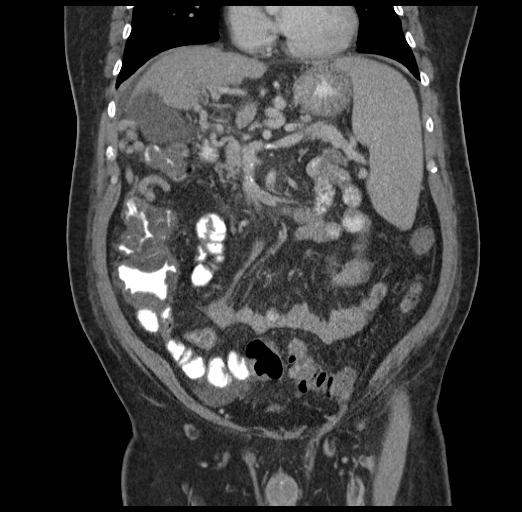
[im 59/107  soft-tissue]
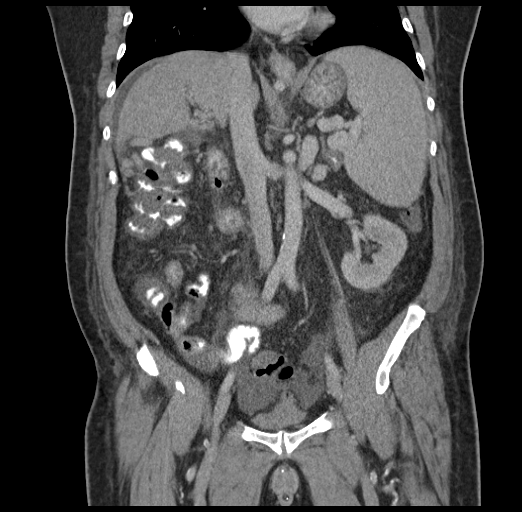

[17 of 46 positions shown; findings below may reference images not displayed]

FINDINGS: Liver is small and slightly irregular. Prominent splenomegaly is
present. Multiple periesophageal, perigastric, perisplenic, and
retroperitoneal prominent varices are present. Umbilical vein is
recanalized. These findings are consistent with cirrhosis with
portal hypertension. Portal vein is patent. Splenic vein is patent.

Adrenals normal. Kidneys are normal. No hydronephrosis. No
obstructing ureteral stone. Foley catheter within bladder. Air noted
within bladder, most likely from instrumentation.

Shotty inguinal lymph nodes. Aorta normal caliber. Visceral vessels
are patent.

Appendix normal. No inflammatory change in right or left lower
quadrant. Stool noted throughout the colon. No bowel distention. No
free air. Small sliding hiatal hernia. Soft tissue thickening noted
in the region of the cecum most likely related to stool as opposed
to true mass lesion. Mild ascites.

Lung bases clear.  Heart size normal.  No acute bony abnormality.
IMPRESSION: Cirrhosis with portal hypertension resulting in diffuse prominent
varices and splenomegaly. Mild ascites.

## 2016-04-26 ENCOUNTER — Encounter (HOSPITAL_COMMUNITY): Payer: Self-pay

## 2016-04-27 ENCOUNTER — Ambulatory Visit (HOSPITAL_COMMUNITY): Payer: Medicare Other | Attending: Emergency Medicine | Admitting: Physical Therapy

## 2016-04-27 DIAGNOSIS — M25571 Pain in right ankle and joints of right foot: Secondary | ICD-10-CM | POA: Diagnosis present

## 2016-04-27 DIAGNOSIS — L97313 Non-pressure chronic ulcer of right ankle with necrosis of muscle: Secondary | ICD-10-CM | POA: Diagnosis present

## 2016-04-27 NOTE — Therapy (Signed)
[place], [place], [address] Phone: [phone]   Fax:  [phone]  Wound Care Evaluation  Patient Details  Name: Caleb Riley MRN: [id number] Date of Birth: 24-Aug-1951 No Data Recorded  Encounter Date: 04/27/2016      PT End of Session - 04/27/16 1220    Visit Number 1   Number of Visits 24   Date for PT Re-Evaluation 05/27/16   Authorization - Visit Number 1   Authorization - Number of Visits 10   PT Start Time 0815   PT Stop Time 0900   PT Time Calculation (min) 45 min   Activity Tolerance Patient tolerated treatment well      Past Medical History:  Diagnosis Date  . Diabetes mellitus without complication   . Diarrhea, travelers'   . GERD (gastroesophageal reflux disease)   . Hypertension   . Staphylococcus aureus bacteremia     Past Surgical History:  Procedure Laterality Date  . i and d    . SKIN GRAFT      There were no vitals filed for this visit.         Wound Therapy - 04/27/16 1206    Subjective Pt states that he was bit by a tick in 2005.  He states that due to complications he ended up in a coma and needed skin grafts on his Left leg where the tissue had died.  He states that the wound never really seemed to be totally healed but about 10 days ago it opened, had a foul smell and continued to progress in size therefore he went to he MD who sent him here.   Patient and Family Stated Goals wound to heal    Date of Onset 04/17/16   Prior Treatments self care using hydrogen peroxide.   Pain Assessment 0-10   Pain Score 3    Pain Type Chronic pain   Pain Location Foot   Pain Orientation Left;Anterior   Pain Descriptors / Indicators Constant   Pain Onset On-going   Patients Stated Pain Goal 0   Pain Intervention(s) Cutaneous stimulation   Multiple Pain Sites No   Evaluation and Treatment Procedures Explained to Patient/Family Yes   Evaluation and Treatment Procedures agreed to    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     Dressing Type --  no dressing on wound when pt entered   Dressing Changed New   Dressing Status None   Dressing Change Frequency PRN   Site / Wound Assessment Dry;Yellow   % Wound base Red or Granulating 0%   % Wound base Yellow 100%   Peri-wound Assessment Intact   Wound Length (cm) 3 cm   Wound Width (cm) 2.1 cm   Wound Depth (cm) --  unknown   Undermining (cm) from 11 to 3 o'clock max 1.5 cm    Drainage Amount None   Treatment Cleansed;Debridement (Selective);Hydrotherapy (Pulse lavage);Packing (Saline gauze);Other (Comment)   Pulsed lavage therapy - wound location wound bed   Pulsed Lavage with Suction (psi) 4 psi   Pulsed Lavage with Suction - Normal Saline Used 1000 mL   Pulsed Lavage Tip Tip with splash shield   Wound Therapy - Clinical Statement Mr. Dobin is a 65 yo male who has been referred to skilled physical therapy for wound care.  He states that he recieved an order for antibiotic yesterday and is going  to pick it up today.  He states that the wound has grown rapidly in size and now has a foul smell.  Mr. Stasi will benefit from wound care to create a healing enviornment to stop the progression and then decreae the wound size.    Wound Therapy - Functional Problem List Pain, diffcult to wear a shoe    Factors Delaying/Impairing Wound Healing Altered sensation;Diabetes Mellitus;Infection - systemic/local;Multiple medical problems;Polypharmacy   Hydrotherapy Plan Debridement;Dressing change;Patient/family education;Pulsatile lavage with suction   Wound Therapy - Frequency 3X / week  x 8 weeks   Wound Therapy - Current Recommendations PT   Wound Plan as above    Dressing  medihoney to wound,  moistened 2" cling packed into undermining area and ontop of medihoney followed by kling and netting                           PT Education - 05/14/16 1218    Education provided Yes   Education Details keep wound moist and covered.  If dressing becomes wet redress if not attempt to keep the same dressing on until next treatment.   Person(s) Educated Patient   Methods Explanation   Comprehension Verbalized understanding          PT Short Term Goals - May 14, 2016 1232      PT SHORT TERM GOAL #1   Title Pt pain to be 0/10 to allow pt to don socks and shoes without difficulty   Time 2   Period Weeks   Status New     PT SHORT TERM GOAL #2   Title Pt wound bed to be 60% granulated to decrease risk of infection    Time 4   Period Weeks   Status New     PT SHORT TERM GOAL #3   Title Pt to be knowledgable in signs of infection and the need to obtain antibiotics immediately    Time 1   Period Weeks     PT SHORT TERM GOAL #4   Title Pt to be knowledgable of good self care for wounds including washing on a regular basis and not using hydrogen peroxide   Time 1   Period Weeks           PT Long Term Goals - 14-May-2016 1235      PT LONG TERM GOAL #1   Title Wound bed to be 100% granulated    Time 6   Period Weeks   Status New     PT LONG TERM GOAL #2   Title Wound to have no undermining   Time 6   Period Weeks   Status New     PT LONG TERM GOAL #3   Title Wound size to be 1.5x .5 x .3 or smaller for family to be confident in self care   Time 8   Period Weeks            Patient will benefit from skilled therapeutic intervention in order to improve the following deficits and impairments:     Visit Diagnosis: 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 Obesity 09/21/2013  . Staphylococcus aureus bacteremia 09/20/2013  . Cirrhosis of liver ([place]) 09/20/2013  . Sepsis ([place]) 09/19/2013  . UTI (lower urinary tract infection) 09/19/2013  . Lethargy 09/19/2013  . Diabetes mellitus, type 2 ([place]) 09/19/2013  . Essential hypertension, benign 09/19/2013  . Hypotension, unspecified 09/19/2013  . Encephalopathy, hepatic ([place]) 09/19/2013  . Pancytopenia [facility]) 09/19/2013    [name], PT CLT [phone] 04/27/2016, 12:36 PM  [place] [address] [place], [place], [address] Phone: [phone]   Fax:  [phone]  Name: Jamisen Steketee MRN: [id number] Date of Birth: 1951-04-25

## 2016-05-02 ENCOUNTER — Ambulatory Visit (HOSPITAL_COMMUNITY): Payer: Medicare Other

## 2016-05-02 DIAGNOSIS — L97313 Non-pressure chronic ulcer of right ankle with necrosis of muscle: Secondary | ICD-10-CM | POA: Diagnosis not present

## 2016-05-02 DIAGNOSIS — M25571 Pain in right ankle and joints of right foot: Secondary | ICD-10-CM

## 2016-05-02 NOTE — Therapy (Signed)
[place], [place], [address] Phone: [phone]   Fax:  [phone]  Physical Therapy Treatment  Patient Details  Name: Caleb Riley MRN: [id number] Date of Birth: Oct 06, 1950 No Data Recorded  Encounter Date: 05/02/2016      PT End of Session - 05/02/16 1815    Visit Number 2   Number of Visits 24   Date for PT Re-Evaluation 05/27/16   Authorization - Visit Number 2   Authorization - Number of Visits 10   PT Start Time [id number]   PT Stop Time 1730   PT Time Calculation (min) 38 min      Past Medical History:  Diagnosis Date  . Diabetes mellitus without complication   . Diarrhea, travelers'   . GERD (gastroesophageal reflux disease)   . Hypertension   . Staphylococcus aureus bacteremia     Past Surgical History:  Procedure Laterality Date  . i and d    . SKIN GRAFT      There were no vitals filed for this visit.      Subjective Assessment - 05/02/16 1807    Subjective Pt stated foot pain scale 6/10.  Reports increased drainage through dressings, did change with the dressings intact at entrace   Currently in Pain? Yes   Pain Score 6    Pain Location Foot   Pain Orientation Left;Anterior   Pain Descriptors / Indicators Constant   Pain Type Chronic pain                       Wound Therapy - 05/02/16 1807    Subjective Pt stated foot pain scale 6/10.  Reports increased drainage through dressings, did change with the dressings intact at entrace   Patient and Family Stated Goals wound to heal    Date of Onset 04/17/16   Prior Treatments self care using hydrogen peroxide.   Pain Assessment 0-10   Pain Onset On-going   Patients Stated Pain Goal 0   Pain Intervention(s) Cutaneous stimulation   Multiple Pain Sites No   Evaluation and Treatment Procedures Explained to Patient/Family Yes   Evaluation and Treatment Procedures agreed to   Wound Properties Date First Assessed: 04/27/16  Time First Assessed: 0821 Wound Type: Other (Comment);Diabetic ulcer , complication from tick bite     Dressing Type Gauze (Comment);Abdominal pads;Moist to moist  medihoney in moist to moist; ABD pad, gauze and netting   Dressing Changed New   Dressing Status None   Dressing Change Frequency PRN   Site / Wound Assessment Dry;Yellow   % Wound base Red or Granulating 0%   % Wound base Yellow 100%   Margins Unattached edges (unapproximated)   Drainage Amount Minimal   Drainage Description Odor   Treatment Cleansed;Debridement (Selective);Hydrotherapy (Pulse lavage)   Pulsed lavage therapy - wound location wound bed   Pulsed Lavage with Suction (psi) 4 psi   Pulsed Lavage with Suction - Normal Saline Used 1000 mL   Pulsed Lavage Tip Tip with splash shield   Selective Debridement - Location foot   Selective Debridement - Tools Used Forceps;Scalpel;Scissors   Selective Debridement - Tissue Removed dead skin and necrotic tissue   Wound Therapy - Clinical Statement Pt entered dept with increased drainage on wound of food.  Selective debridement for removal of dry skin perimeter of wound, slough and necrotic tissue to promote healing.  Continued with medihoney packed and ABD pad with gauze, kerlix and netting.  No reports of increased pain.     Wound Therapy - Functional Problem List Pain, diffcult to wear a shoe    Factors Delaying/Impairing Wound Healing Altered sensation;Diabetes Mellitus;Infection - systemic/local;Multiple medical problems;Polypharmacy   Hydrotherapy Plan Debridement;Dressing change;Patient/family education;Pulsatile lavage with suction   Wound Therapy - Frequency 3X / week   Wound Therapy - Current Recommendations PT   Wound Plan Continue appropraite wound care    Dressing  medihoney to wound,  moistened 2" cling packed into undermining area and ontop of medihoney followed by kling and netting                     PT Short Term Goals - 04/27/16 1232      PT  SHORT TERM GOAL #1   Title Pt pain to be 0/10 to allow pt to don socks and shoes without difficulty   Time 2   Period Weeks   Status New     PT SHORT TERM GOAL #2   Title Pt wound bed to be 60% granulated to decrease risk of infection    Time 4   Period Weeks   Status New     PT SHORT TERM GOAL #3   Title Pt to be knowledgable in signs of infection and the need to obtain antibiotics immediately    Time 1   Period Weeks     PT SHORT TERM GOAL #4   Title Pt to be knowledgable of good self care for wounds including washing on a regular basis and not using hydrogen peroxide   Time 1   Period Weeks           PT Long Term Goals - 04/27/16 1235      PT LONG TERM GOAL #1   Title Wound bed to be 100% granulated    Time 6   Period Weeks   Status New     PT LONG TERM GOAL #2   Title Wound to have no undermining   Time 6   Period Weeks   Status New     PT LONG TERM GOAL #3   Title Wound size to be 1.5x .5 x .3 or smaller for family to be confident in self care   Time 8   Period Weeks             Patient will benefit from skilled therapeutic intervention in order to improve the following deficits and impairments:     Visit Diagnosis: 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 Obesity 09/21/2013  . Staphylococcus aureus bacteremia 09/20/2013  . Cirrhosis of liver ([place]) 09/20/2013  . Sepsis ([place]) 09/19/2013  . UTI (lower urinary tract infection) 09/19/2013  . Lethargy 09/19/2013  . Diabetes mellitus, type 2 ([place]) 09/19/2013  . Essential hypertension, benign 09/19/2013  . Hypotension, unspecified 09/19/2013  . Encephalopathy, hepatic ([place]) 09/19/2013  . Pancytopenia ([place]) 09/19/2013   Ihor Austin, [place]; [place]  Aldona Lento 05/02/2016, [facility] [address] [place], [place], [address] Phone: [phone]   Fax:  [phone]  Name: Caleb Riley MRN: [id number] Date of Birth: 07/18/1951

## 2016-05-04 ENCOUNTER — Ambulatory Visit (HOSPITAL_COMMUNITY): Payer: Medicare Other | Admitting: Physical Therapy

## 2016-05-04 DIAGNOSIS — L97313 Non-pressure chronic ulcer of right ankle with necrosis of muscle: Secondary | ICD-10-CM | POA: Diagnosis not present

## 2016-05-04 DIAGNOSIS — M25571 Pain in right ankle and joints of right foot: Secondary | ICD-10-CM

## 2016-05-04 NOTE — Therapy (Signed)
Caleb Riley, [place], [address] Phone: [phone]   Fax:  [phone]  Wound Care Therapy  Patient Details  Name: Caleb Riley MRN: [id number] Date of Birth: 1950/12/26 No Data Recorded  Encounter Date: 05/04/2016      PT End of Session - 05/04/16 1727    Visit Number 3   Number of Visits 24   Date for PT Re-Evaluation 05/27/16   Authorization - Visit Number 3   Authorization - Number of Visits 10   PT Start Time 1700   PT Stop Time 1725   PT Time Calculation (min) 25 min   Activity Tolerance Patient tolerated treatment well   Behavior During Therapy [facility] for tasks assessed/performed      Past Medical History:  Diagnosis Date  . Diabetes mellitus without complication   . Diarrhea, travelers'   . GERD (gastroesophageal reflux disease)   . Hypertension   . Staphylococcus aureus bacteremia     Past Surgical History:  Procedure Laterality Date  . i and d    . SKIN GRAFT      There were no vitals filed for this visit.                  Wound Therapy - 05/04/16 1723    Subjective pt states his foot is not hurting him.  dressing is intact.   Patient and Family Stated Goals wound to heal    Date of Onset 04/17/16   Prior Treatments self care using hydrogen peroxide.   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     Dressing Type Gauze (Comment);Abdominal pads;Moist to moist  medihoney in moist to moist; ABD pad, gauze and netting   Dressing Changed Changed   Dressing Status None   Dressing Change Frequency PRN   Site / Wound Assessment Dry;Yellow   % Wound base Red or Granulating 0%   % Wound base Yellow 100%   Margins Unattached edges (unapproximated)   Drainage Amount Minimal   Drainage Description Serosanguineous;No odor   Treatment Hydrotherapy (Pulse lavage);Debridement (Selective)    Pulsed lavage therapy - wound location wound bed   Pulsed Lavage with Suction (psi) 4 psi   Pulsed Lavage with Suction - Normal Saline Used 1000 mL   Pulsed Lavage Tip Tip with splash shield   Selective Debridement - Location foot   Selective Debridement - Tools Used Forceps;Scalpel;Scissors   Selective Debridement - Tissue Removed dead skin and necrotic tissue   Wound Therapy - Clinical Statement Noted maceration around wound bed.  Debrided slough and devitalized tissue prior and following pulsed lavage.  Packed 2X2 medihoney gel gauze into woundbed carefully not getting onto surrounding skin.  Used ABD pad, kerlix and #5 netting.  Pt without c/o pain during session.     Wound Therapy - Functional Problem List Pain, diffcult to wear a shoe    Factors Delaying/Impairing Wound Healing Altered sensation;Diabetes Mellitus;Infection - systemic/local;Multiple medical problems;Polypharmacy   Hydrotherapy Plan Debridement;Dressing change;Patient/family education;Pulsatile lavage with suction   Wound Therapy - Frequency 3X / week   Wound Therapy - Current Recommendations PT   Wound Plan Continue appropraite wound care    Dressing  medihoney to wound,  moistened 2" cling packed into undermining area and ontop of medihoney followed by kling and netting  PT Short Term Goals - 04/27/16 1232      PT SHORT TERM GOAL #1   Title Pt pain to be 0/10 to allow pt to don socks and shoes without difficulty   Time 2   Period Weeks   Status New     PT SHORT TERM GOAL #2   Title Pt wound bed to be 60% granulated to decrease risk of infection    Time 4   Period Weeks   Status New     PT SHORT TERM GOAL #3   Title Pt to be knowledgable in signs of infection and the need to obtain antibiotics immediately    Time 1   Period Weeks     PT SHORT TERM GOAL #4   Title Pt to be knowledgable of good self care for wounds including washing on a regular basis and not using hydrogen  peroxide   Time 1   Period Weeks           PT Long Term Goals - 04/27/16 1235      PT LONG TERM GOAL #1   Title Wound bed to be 100% granulated    Time 6   Period Weeks   Status New     PT LONG TERM GOAL #2   Title Wound to have no undermining   Time 6   Period Weeks   Status New     PT LONG TERM GOAL #3   Title Wound size to be 1.5x .5 x .3 or smaller for family to be confident in self care   Time 8   Period Weeks             Patient will benefit from skilled therapeutic intervention in order to improve the following deficits and impairments:     Visit Diagnosis: 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 Obesity 09/21/2013  . Staphylococcus aureus bacteremia 09/20/2013  . Cirrhosis of liver ([place]) 09/20/2013  . Sepsis ([place]) 09/19/2013  . UTI (lower urinary tract infection) 09/19/2013  . Lethargy 09/19/2013  . Diabetes mellitus, type 2 ([place]) 09/19/2013  . Essential hypertension, benign 09/19/2013  . Hypotension, unspecified 09/19/2013  . Encephalopathy, hepatic ([place]) 09/19/2013  . Pancytopenia ([place]) 09/19/2013    Teena Irani, PTA/CLT [phone]  05/04/2016, 5:28 PM  [facility] [address] [place], [place], [address] Phone: [phone]   Fax:  [phone]  Name: Caleb Riley MRN: [id number] Date of Birth: 31-Oct-1950

## 2016-05-08 ENCOUNTER — Ambulatory Visit (HOSPITAL_COMMUNITY): Payer: Medicare Other | Admitting: Physical Therapy

## 2016-05-08 DIAGNOSIS — M25571 Pain in right ankle and joints of right foot: Secondary | ICD-10-CM

## 2016-05-08 DIAGNOSIS — L97313 Non-pressure chronic ulcer of right ankle with necrosis of muscle: Secondary | ICD-10-CM

## 2016-05-08 NOTE — Therapy (Signed)
[place], [place], [address] Phone: [phone]   Fax:  [phone]  Wound Care Therapy  Patient Details  Name: Caleb Riley MRN: [id number] Date of Birth: 05-16-1951 No Data Recorded  Encounter Date: 05/08/2016      PT End of Session - 05/08/16 1558    Visit Number 4   Number of Visits 24   Date for PT Re-Evaluation 05/27/16   Authorization - Visit Number 4   Authorization - Number of Visits 10   Activity Tolerance Patient tolerated treatment well   Behavior During Therapy [facility] for tasks assessed/performed      Past Medical History:  Diagnosis Date  . Diabetes mellitus without complication   . Diarrhea, travelers'   . GERD (gastroesophageal reflux disease)   . Hypertension   . Staphylococcus aureus bacteremia     Past Surgical History:  Procedure Laterality Date  . i and d    . SKIN GRAFT      There were no vitals filed for this visit.                  Wound Therapy - 05/08/16 1554    Subjective Pt without reports of pain.   Patient and Family Stated Goals wound to heal    Date of Onset 04/17/16   Prior Treatments self care using hydrogen peroxide.   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     Dressing Type Gauze (Comment);Abdominal pads;Moist to moist  medihoney in moist to moist; ABD pad, gauze and netting   Dressing Changed Changed   Dressing Status None   Dressing Change Frequency PRN   Site / Wound Assessment Dry;Yellow   % Wound base Red or Granulating 30%   % Wound base Yellow 70%   Margins Unattached edges (unapproximated)   Drainage Amount Minimal   Drainage Description Serosanguineous;No odor   Treatment Hydrotherapy (Pulse lavage);Debridement (Selective)   Pulsed lavage therapy - wound location wound bed   Pulsed Lavage with Suction (psi) 4 psi   Pulsed Lavage  with Suction - Normal Saline Used 1000 mL   Pulsed Lavage Tip Tip with splash shield   Selective Debridement - Location foot   Selective Debridement - Tools Used Forceps;Scalpel;Scissors   Selective Debridement - Tissue Removed dead skin and necrotic tissue   Wound Therapy - Clinical Statement Packing had moved out of wound bed due to location of wound/rubbing on shoes.  Maceration present in this area.  Noted improvement in granualtion tissue following PL and debridement this session.  Unsure if there is an artery present on outer edge as did not debride too heavily around this area.  Used medipore this session to secure wound and decrease slippage and packing from escaping wound bed.  Pt reported overall comfort at EOS.   Wound Therapy - Functional Problem List Pain, diffcult to wear a shoe    Factors Delaying/Impairing Wound Healing Altered sensation;Diabetes Mellitus;Infection - systemic/local;Multiple medical problems;Polypharmacy   Hydrotherapy Plan Debridement;Dressing change;Patient/family education;Pulsatile lavage with suction   Wound Therapy - Frequency 3X / week   Wound Therapy - Current Recommendations PT   Wound Plan Continue appropraite wound care    Dressing  medihoney  moistened 2X2 in wound bed, 4X4, ABD, medipore tape and #5 netting                   PT Short Term  Goals - 04/27/16 1232      PT SHORT TERM GOAL #1   Title Pt pain to be 0/10 to allow pt to don socks and shoes without difficulty   Time 2   Period Weeks   Status New     PT SHORT TERM GOAL #2   Title Pt wound bed to be 60% granulated to decrease risk of infection    Time 4   Period Weeks   Status New     PT SHORT TERM GOAL #3   Title Pt to be knowledgable in signs of infection and the need to obtain antibiotics immediately    Time 1   Period Weeks     PT SHORT TERM GOAL #4   Title Pt to be knowledgable of good self care for wounds including washing on a regular basis and not using hydrogen  peroxide   Time 1   Period Weeks           PT Long Term Goals - 04/27/16 1235      PT LONG TERM GOAL #1   Title Wound bed to be 100% granulated    Time 6   Period Weeks   Status New     PT LONG TERM GOAL #2   Title Wound to have no undermining   Time 6   Period Weeks   Status New     PT LONG TERM GOAL #3   Title Wound size to be 1.5x .5 x .3 or smaller for family to be confident in self care   Time 8   Period Weeks             Patient will benefit from skilled therapeutic intervention in order to improve the following deficits and impairments:     Visit Diagnosis: 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 Obesity 09/21/2013  . Staphylococcus aureus bacteremia 09/20/2013  . Cirrhosis of liver ([place]) 09/20/2013  . Sepsis ([place]) 09/19/2013  . UTI (lower urinary tract infection) 09/19/2013  . Lethargy 09/19/2013  . Diabetes mellitus, type 2 ([place]) 09/19/2013  . Essential hypertension, benign 09/19/2013  . Hypotension, unspecified 09/19/2013  . Encephalopathy, hepatic ([place]) 09/19/2013  . Pancytopenia ([place]) 09/19/2013    Teena Irani, PTA/CLT [phone]  05/08/2016, 3:59 PM  [facility] [address] [place], [place], [address] Phone: [phone]   Fax:  [phone]  Name: Caleb Riley MRN: [id number] Date of Birth: 1951/08/05

## 2016-05-10 ENCOUNTER — Ambulatory Visit (HOSPITAL_COMMUNITY): Payer: Medicare Other

## 2016-05-10 ENCOUNTER — Telehealth (HOSPITAL_COMMUNITY): Payer: Self-pay | Admitting: Physical Therapy

## 2016-05-10 DIAGNOSIS — L97313 Non-pressure chronic ulcer of right ankle with necrosis of muscle: Secondary | ICD-10-CM | POA: Diagnosis not present

## 2016-05-10 DIAGNOSIS — M25571 Pain in right ankle and joints of right foot: Secondary | ICD-10-CM

## 2016-05-10 NOTE — Telephone Encounter (Signed)
Attempted to call pt re: missed appointment.  Mailbox was full and was not allowing messages.  [name], [place] CLT [phone]

## 2016-05-10 NOTE — Therapy (Signed)
[place], [place], [address] Phone: [phone]   Fax:  [phone]  Wound Care Therapy  Patient Details  Name: Caleb Riley MRN: [id number] Date of Birth: 03/04/1951 No Data Recorded  Encounter Date: 05/10/2016      PT End of Session - 05/10/16 1526    Visit Number 5   Number of Visits 24   Date for PT Re-Evaluation 05/27/16   Authorization - Visit Number 5   Authorization - Number of Visits 10   PT Start Time 1446  pt late for apt   PT Stop Time 1514   PT Time Calculation (min) 28 min   Activity Tolerance Patient tolerated treatment well   Behavior During Therapy [facility] for tasks assessed/performed      Past Medical History:  Diagnosis Date  . Diabetes mellitus without complication   . Diarrhea, travelers'   . GERD (gastroesophageal reflux disease)   . Hypertension   . Staphylococcus aureus bacteremia     Past Surgical History:  Procedure Laterality Date  . i and d    . SKIN GRAFT      There were no vitals filed for this visit.       Subjective Assessment - 05/10/16 1532    Subjective Pt's car broke down on way to session today.  Pt stated foot had increased pain last night, feels better upon entrance today.  Current pain scale 6/10 with dressings intact.     Currently in Pain? Yes   Pain Score 6    Pain Location Foot   Pain Orientation Left;Anterior   Pain Descriptors / Indicators Constant   Pain Type Chronic pain                   Wound Therapy - 05/10/16 1532    Subjective Pt's car broke down on way to session today.  Pt stated foot had increased pain last night, feels better upon entrance today.  Current pain scale 6/10 with dressings intact.     Patient and Family Stated Goals wound to heal    Date of Onset 04/17/16   Prior Treatments self care using hydrogen peroxide.   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     Dressing Type Gauze (Comment);Abdominal pads;Moist to moist  medihoney, gauze, ABD, medipore tape and netting   Dressing Changed Changed   Dressing Status None   Dressing Change Frequency PRN   Site / Wound Assessment Dry;Yellow   % Wound base Red or Granulating 30%   % Wound base Yellow 30%   % Wound base Black 40%   Margins Unattached edges (unapproximated)   Drainage Amount Minimal   Drainage Description Serosanguineous;No odor   Treatment Cleansed;Debridement (Selective)  PLS out of stock, resume next session   Selective Debridement - Location foot   Selective Debridement - Tools Used Forceps;Scalpel   Selective Debridement - Tissue Removed dead skin and necrotic tissue   Wound Therapy - Clinical Statement Noted increased maceration perimeter of wound.  Selective debridement for removal of slough and eschar to promote healing.  Unsure if artery present on outer edge of wound so did not complete a lot of debridement that area.  Continued with medihoney with medipore tape to prevent dressings from moving and netting.  Pt reports decreased pain at EOS.     Wound Therapy - Functional Problem List Pain, diffcult to  wear a shoe    Factors Delaying/Impairing Wound Healing Altered sensation;Diabetes Mellitus;Infection - systemic/local;Multiple medical problems;Polypharmacy   Hydrotherapy Plan Debridement;Dressing change;Patient/family education;Pulsatile lavage with suction   Wound Therapy - Frequency 3X / week   Wound Therapy - Current Recommendations PT   Wound Plan Continue appropraite wound care    Dressing  medihoney  moistened 2X2 in wound bed, 4X4, ABD, medipore tape and #5 netting                   PT Short Term Goals - 04/27/16 1232      PT SHORT TERM GOAL #1   Title Pt pain to be 0/10 to allow pt to don socks and shoes without difficulty   Time 2   Period Weeks   Status New     PT SHORT TERM GOAL #2    Title Pt wound bed to be 60% granulated to decrease risk of infection    Time 4   Period Weeks   Status New     PT SHORT TERM GOAL #3   Title Pt to be knowledgable in signs of infection and the need to obtain antibiotics immediately    Time 1   Period Weeks     PT SHORT TERM GOAL #4   Title Pt to be knowledgable of good self care for wounds including washing on a regular basis and not using hydrogen peroxide   Time 1   Period Weeks           PT Long Term Goals - 04/27/16 1235      PT LONG TERM GOAL #1   Title Wound bed to be 100% granulated    Time 6   Period Weeks   Status New     PT LONG TERM GOAL #2   Title Wound to have no undermining   Time 6   Period Weeks   Status New     PT LONG TERM GOAL #3   Title Wound size to be 1.5x .5 x .3 or smaller for family to be confident in self care   Time 8   Period Weeks             Patient will benefit from skilled therapeutic intervention in order to improve the following deficits and impairments:     Visit Diagnosis: 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 Obesity 09/21/2013  . Staphylococcus aureus bacteremia 09/20/2013  . Cirrhosis of liver ([place]) 09/20/2013  . Sepsis ([place]) 09/19/2013  . UTI (lower urinary tract infection) 09/19/2013  . Lethargy 09/19/2013  . Diabetes mellitus, type 2 ([place]) 09/19/2013  . Essential hypertension, benign 09/19/2013  . Hypotension, unspecified 09/19/2013  . Encephalopathy, hepatic ([place]) 09/19/2013  . Pancytopenia ([place]) 09/19/2013   Ihor Austin, [place]; [place]  Aldona Lento 05/10/2016, 4:21 PM  [place], [place], [address] Phone: [phone]   Fax:  [phone]  Name: Caleb Riley MRN: [id number] Date of Birth:  December 16, 1950

## 2016-05-12 ENCOUNTER — Telehealth (HOSPITAL_COMMUNITY): Payer: Self-pay

## 2016-05-12 ENCOUNTER — Ambulatory Visit (HOSPITAL_COMMUNITY): Payer: Medicare Other

## 2016-05-12 NOTE — Telephone Encounter (Signed)
Daughter left a message that he was in the hospital in [place]

## 2016-05-15 ENCOUNTER — Ambulatory Visit (HOSPITAL_COMMUNITY): Payer: Medicare Other | Admitting: Physical Therapy

## 2016-05-17 ENCOUNTER — Ambulatory Visit (HOSPITAL_COMMUNITY): Payer: Medicare Other | Admitting: Physical Therapy

## 2016-05-19 ENCOUNTER — Ambulatory Visit (HOSPITAL_COMMUNITY): Payer: Medicare Other | Admitting: Physical Therapy

## 2016-05-19 ENCOUNTER — Telehealth (HOSPITAL_COMMUNITY): Payer: Self-pay | Admitting: Physical Therapy

## 2016-05-19 NOTE — Telephone Encounter (Signed)
Called pt re missed appointment.  Will cancel appointments until we hear back from pt.   [name], [place] CLT [phone]

## 2016-05-22 ENCOUNTER — Telehealth (HOSPITAL_COMMUNITY): Payer: Self-pay

## 2016-05-22 ENCOUNTER — Ambulatory Visit (HOSPITAL_COMMUNITY): Payer: Medicare Other | Admitting: Physical Therapy

## 2016-05-22 NOTE — Telephone Encounter (Signed)
05/22/16 daughter had left a message to cx today's appt.  I called her back and let her know that Caleb Riley had suggested that I cx all of the appts which I told her.... Caleb Riley thought he was going into skilled care.  Daughter said he was still in the hospital but if they sent him home she would call us back and we would reschedule him.

## 2016-05-24 ENCOUNTER — Ambulatory Visit (HOSPITAL_COMMUNITY): Payer: Medicare Other | Admitting: Physical Therapy

## 2016-05-26 ENCOUNTER — Ambulatory Visit (HOSPITAL_COMMUNITY): Payer: Medicare Other

## 2016-11-09 LAB — HEMOGLOBIN A1C: Hemoglobin A1C: 11.9

## 2016-12-14 ENCOUNTER — Ambulatory Visit (INDEPENDENT_AMBULATORY_CARE_PROVIDER_SITE_OTHER): Payer: Medicare Other | Admitting: "Endocrinology

## 2016-12-14 ENCOUNTER — Encounter: Payer: Self-pay | Admitting: "Endocrinology

## 2016-12-14 VITALS — BP 126/74 | HR 61 | Ht 68.0 in | Wt 202.0 lb

## 2016-12-14 DIAGNOSIS — I10 Essential (primary) hypertension: Secondary | ICD-10-CM | POA: Diagnosis not present

## 2016-12-14 DIAGNOSIS — E1169 Type 2 diabetes mellitus with other specified complication: Secondary | ICD-10-CM

## 2016-12-14 DIAGNOSIS — E1165 Type 2 diabetes mellitus with hyperglycemia: Secondary | ICD-10-CM

## 2016-12-14 DIAGNOSIS — Z794 Long term (current) use of insulin: Secondary | ICD-10-CM | POA: Diagnosis not present

## 2016-12-14 DIAGNOSIS — IMO0002 Reserved for concepts with insufficient information to code with codable children: Secondary | ICD-10-CM

## 2016-12-14 MED ORDER — INSULIN ASPART 100 UNIT/ML FLEXPEN: 10.0000 [IU] | PEN_INJECTOR | Freq: Three times a day (TID) | SUBCUTANEOUS | 2 refills | Status: DC

## 2016-12-14 MED ORDER — INSULIN DETEMIR 100 UNIT/ML FLEXPEN: 35.0000 [IU] | PEN_INJECTOR | Freq: Every day | SUBCUTANEOUS | 2 refills | Status: DC

## 2016-12-14 NOTE — Progress Notes (Signed)
Subjective:    Patient ID: Caleb Riley, male    DOB: 04-17-1951. Patient is being seen in consultation for management of diabetes requested by  Vidal Schwalbe, MD  Past Medical History:  Diagnosis Date  . Diabetes mellitus without complication ([place])   . Diarrhea, travelers'   . GERD (gastroesophageal reflux disease)   . Hypertension   . Staphylococcus aureus bacteremia    Past Surgical History:  Procedure Laterality Date  . i and d    . SKIN GRAFT     Social History   Social History  . Marital status: Single    Spouse name: N/A  . Number of children: N/A  . Years of education: N/A   Social History Main Topics  . Smoking status: Never Smoker  . Smokeless tobacco: Never Used  . Alcohol use No     Comment: former alcoholic  . Drug use: No  . Sexual activity: Not Currently   Other Topics Concern  . None   Social History Narrative   ** Merged History Encounter **       Outpatient Encounter Prescriptions as of 12/14/2016  Medication Sig  . atorvastatin (LIPITOR) 10 MG tablet Take 10 mg by mouth daily.  . insulin aspart (NOVOLOG FLEXPEN) 100 UNIT/ML FlexPen Inject 10-16 Units into the skin 3 (three) times daily with meals.  . Insulin Detemir (LEVEMIR FLEXPEN) 100 UNIT/ML Pen Inject 35 Units into the skin daily at 10 pm.  . lactulose (CHRONULAC) 10 GM/15ML solution Take 30 g by mouth 2 (two) times daily.  Marland Kitchen omeprazole (PRILOSEC) 20 MG capsule Take 20 mg by mouth daily.  . propranolol (INDERAL) 20 MG tablet Take 1 tablet (20 mg total) by mouth 2 (two) times daily.  . rifaximin (XIFAXAN) 550 MG TABS tablet Take 550 mg by mouth 2 (two) times daily.  Marland Kitchen spironolactone (ALDACTONE) 25 MG tablet Take 25 mg by mouth daily.  . tamsulosin (FLOMAX) 0.4 MG CAPS capsule Take 0.4 mg by mouth daily.  . Vitamin D, Ergocalciferol, (DRISDOL) 50000 units CAPS capsule Take 50,000 Units by mouth every 7 (seven) days.  . [DISCONTINUED] insulin aspart (NOVOLOG FLEXPEN) 100 UNIT/ML FlexPen  Inject 10 Units into the skin 3 (three) times daily with meals.  . [DISCONTINUED] Insulin Detemir (LEVEMIR FLEXPEN) 100 UNIT/ML Pen Inject 35 Units into the skin daily at 10 pm.  . [DISCONTINUED] B-D INS SYRINGE 0.5CC/31GX5/16 31G X 5/16" 0.5 ML MISC   . [DISCONTINUED] insulin NPH-regular (NOVOLIN 70/30) (70-30) 100 UNIT/ML injection Inject 40-75 Units into the skin 2 (two) times daily with a meal. Takes 75 units in the morning and 40 units at bedtime. Holds morning dose for low/normal sugars  . [DISCONTINUED] losartan (COZAAR) 100 MG tablet Take 100 mg by mouth daily.  . [DISCONTINUED] mupirocin ointment (BACTROBAN) 2 % Place 1 application into the nose 2 (two) times daily. For the first 5 days of the month, for the next 6 months.   No facility-administered encounter medications on file as of 12/14/2016.    ALLERGIES: No Known Allergies VACCINATION STATUS: Immunization History  Administered Date(s) Administered  . Influenza,inj,Quad PF,36+ Mos 10/14/2013    Diabetes  He presents for his initial diabetic visit. He has type 2 diabetes mellitus. Onset time: He was diagnosed at approximate age of 89 years. His disease course has been worsening. There are no hypoglycemic associated symptoms. Pertinent negatives for hypoglycemia include no confusion, headaches, pallor or seizures. Associated symptoms include blurred vision, polydipsia and polyuria. Pertinent negatives for  diabetes include no chest pain, no fatigue, no polyphagia and no weakness. There are no hypoglycemic complications. Symptoms are worsening. Diabetic complications include peripheral neuropathy and PVD. Risk factors for coronary artery disease include diabetes mellitus, dyslipidemia, hypertension, male sex, sedentary lifestyle and tobacco exposure. Current diabetic treatment includes intensive insulin program. His weight is fluctuating minimally. He is following a generally unhealthy diet. When asked about meal planning, he reported  none. He has not had a previous visit with a dietitian. He never participates in exercise. (He did not bring any meter nor logs to review today. He admits he does not monitor blood glucose regularly. His most recent A1c was 11.9%.)  Hypertension  This is a chronic problem. The current episode started more than 1 year ago. The problem is controlled. Associated symptoms include blurred vision. Pertinent negatives include no chest pain, headaches, neck pain, palpitations or shortness of breath. Risk factors for coronary artery disease include smoking/tobacco exposure, sedentary lifestyle, dyslipidemia, diabetes mellitus, obesity and male gender. Hypertensive end-organ damage includes PVD.  Hyperlipidemia  This is a chronic problem. The current episode started more than 1 year ago. Exacerbating diseases include diabetes and obesity. Associated symptoms include myalgias. Pertinent negatives include no chest pain or shortness of breath. Current antihyperlipidemic treatment includes statins. Risk factors for coronary artery disease include diabetes mellitus, dyslipidemia, hypertension, male sex, a sedentary lifestyle and obesity.    Review of Systems  Constitutional: Negative for chills, fatigue, fever and unexpected weight change.  HENT: Negative for dental problem, mouth sores and trouble swallowing.   Eyes: Positive for blurred vision. Negative for visual disturbance.  Respiratory: Negative for cough, choking, chest tightness, shortness of breath and wheezing.   Cardiovascular: Positive for leg swelling. Negative for chest pain and palpitations.  Gastrointestinal: Negative for abdominal distention, abdominal pain, constipation, diarrhea, nausea and vomiting.  Endocrine: Positive for polydipsia and polyuria. Negative for polyphagia.  Genitourinary: Negative for dysuria, flank pain, hematuria and urgency.  Musculoskeletal: Positive for gait problem and myalgias. Negative for back pain and neck pain.   Skin: Negative for pallor, rash and wound.  Neurological: Negative for seizures, syncope, weakness, numbness and headaches.  Psychiatric/Behavioral: Negative.  Negative for confusion and dysphoric mood.    Objective:    BP 126/74   Pulse 61   Ht 5\' 8"  (1.727 m)   Wt 202 lb (91.6 kg)   BMI 30.71 kg/m   Wt Readings from Last 3 Encounters:  12/14/16 202 lb (91.6 kg)  11/17/13 232 lb (105.2 kg)  10/14/13 233 lb (105.7 kg)    Physical Exam  Constitutional: He is oriented to person, place, and time. He appears well-developed and well-nourished. He is cooperative. No distress.  HENT:  Head: Normocephalic and atraumatic.  Eyes: EOM are normal.  Neck: Normal range of motion. Neck supple. No tracheal deviation present. No thyromegaly present.  Cardiovascular: Normal rate, S1 normal, S2 normal and normal heart sounds.  Exam reveals no gallop.   No murmur heard. Pulses:      Dorsalis pedis pulses are 1+ on the right side, and 1+ on the left side.       Posterior tibial pulses are 1+ on the right side, and 1+ on the left side.  Pulmonary/Chest: Breath sounds normal. No respiratory distress. He has no wheezes.  Abdominal: Soft. Bowel sounds are normal. He exhibits no distension. There is no tenderness. There is no guarding and no CVA tenderness.  Musculoskeletal: He exhibits no edema.  Right shoulder: He exhibits no swelling and no deformity.  Old scar on the left lower extremity which has significant deformity from prior injuries.  Neurological: He is alert and oriented to person, place, and time. He has normal strength and normal reflexes. No cranial nerve deficit or sensory deficit. Gait normal.  Skin: Skin is warm and dry. No rash noted. No cyanosis. Nails show no clubbing.  Psychiatric: He has a normal mood and affect. His speech is normal. Cognition and memory are normal.     CMP ( most recent) CMP     Component Value Date/Time   NA 144 01/31/2014 2100   K 4.1 01/31/2014  2100   CL 112 01/31/2014 2100   CO2 24 01/31/2014 2100   GLUCOSE 27 (LL) 01/31/2014 2100   BUN 11 01/31/2014 2100   CREATININE 0.94 01/31/2014 2100   CALCIUM 8.1 (L) 01/31/2014 2100   PROT 6.4 01/31/2014 2100   ALBUMIN 2.5 (L) 01/31/2014 2100   AST 51 (H) 01/31/2014 2100   ALT 40 01/31/2014 2100   ALKPHOS 154 (H) 01/31/2014 2100   BILITOT 1.8 (H) 01/31/2014 2100   GFRNONAA 89 (L) 01/31/2014 2100   GFRAA >90 01/31/2014 2100     Diabetic Labs (most recent): Lab Results  Component Value Date   HGBA1C 11.9 11/09/2016   HGBA1C 8.3 (H) 09/19/2013     Assessment & Plan:   1. Uncontrolled type 2 diabetes mellitus with other specified complication, with long-term current use of insulin ([place])  - Patient has currently uncontrolled symptomatic type 2 DM since  66 years of age,  with most recent A1c of 11.9 %. Recent labs reviewed.   He has a history of alcoholic cirrhosis, he is diabetes is complicated by peripheral arterial disease, sedentary life and patient remains at a high risk for more acute and chronic complications of diabetes which include CAD, CVA, CKD, retinopathy, and neuropathy. These are all discussed in detail with the patient.  - I have counseled the patient on diet management and weight loss, by adopting a carbohydrate restricted/protein rich diet.  - Suggestion is made for patient to avoid simple carbohydrates   from their diet including Cakes , Desserts, Ice Cream,  Soda (  diet and regular) , Sweet Tea , Candies,  Chips, Cookies, Artificial Sweeteners,   and "Sugar-free" Products . This will help patient to have stable blood glucose profile and potentially avoid unintended weight gain.  - I encouraged the patient to switch to  unprocessed or minimally processed complex starch and increased protein intake (animal or plant source), fruits, and vegetables.  - Patient is advised to stick to a routine mealtimes to eat 3 meals  a day and avoid unnecessary snacks ( to snack  only to correct hypoglycemia).  - The patient will be scheduled with Jearld Fenton, RDN, CDE for individualized DM education.  - I have approached patient with the following individualized plan to manage diabetes and patient agrees:   - Due to his alcohol history with cirrhosis, he does not have suitable options for oral medications to treat diabetes.  - He will continue to require insulin in the basal/bolus schedule -  I  will proceed with basal insulin Levemir 35 units daily at bedtime, NovoLog 10 units 3 times a day before meals for pre-meal BG readings of 90-150mg /dl, plus patient specific correction dose for unexpected hyperglycemia above 150mg /dl, associated with strict monitoring of glucose  AC and HS. - Patient is warned not to take insulin without  proper monitoring per orders. -Adjustment parameters are given for hypo and hyperglycemia in writing. -Patient is encouraged to call clinic for blood glucose levels less than 70 or above 300 mg /dl.  -Patient is not a candidate for metformin, incretin therapy due to cirrhosis and history of alcoholism respectively.   - Patient specific target  A1c;  LDL, HDL, Triglycerides, and  Waist Circumference were discussed in detail.  2)  HTN:  Controlled. continue current medications including spironolactone 25 mg by mouth daily  3) Lipids: control unknown .  Patient is advised tocontinue statins. 4)  Weight/Diet: CDE Consult will be initiated , exercise, and detailed carbohydrates information provided.  5) Chronic Care/Health Maintenance:  -Patient  Is on Statin medications and encouraged to continue to follow up with Ophthalmology, Podiatrist at least yearly or according to recommendations, and advised to  y away from smoking. I have recommended yearly flu vaccine and pneumonia vaccination at least every 5 years; moderate intensity exercise for up to 150 minutes weekly; and  sleep for at least 7 hours a day.  - 60 minutes of time was spent on the  care of this patient , 50% of which was applied for counseling on diabetes complications and their preventions.  - Patient to bring meter and  blood glucose logs during his next visit.   - I advised patient to maintain close follow up with Vidal Schwalbe, MD for primary care needs.  Follow up plan: - Return in about 9 weeks (around 02/15/2017) for meter, and logs.  Glade Lloyd, MD Phone: [phone]  Fax: [phone]   12/14/2016, 3:28 PM

## 2016-12-14 NOTE — Patient Instructions (Signed)

## 2017-02-15 ENCOUNTER — Ambulatory Visit: Payer: Medicare Other | Admitting: "Endocrinology

## 2017-02-21 ENCOUNTER — Other Ambulatory Visit: Payer: Self-pay | Admitting: "Endocrinology

## 2017-02-22 LAB — COMPREHENSIVE METABOLIC PANEL
A/G RATIO: 0.9 — AB (ref 1.2–2.2)
ALBUMIN: 3.3 g/dL — AB (ref 3.6–4.8)
ALT: 27 IU/L (ref 0–44)
AST: 27 IU/L (ref 0–40)
Alkaline Phosphatase: 124 IU/L — ABNORMAL HIGH (ref 39–117)
BUN / CREAT RATIO: 15 (ref 10–24)
BUN: 17 mg/dL (ref 8–27)
Bilirubin Total: 1.1 mg/dL (ref 0.0–1.2)
CHLORIDE: 110 mmol/L — AB (ref 96–106)
CO2: 18 mmol/L (ref 18–29)
CREATININE: 1.1 mg/dL (ref 0.76–1.27)
Calcium: 8.2 mg/dL — ABNORMAL LOW (ref 8.6–10.2)
GFR calc Af Amer: 82 mL/min/{1.73_m2} (ref >59)
GFR calc non Af Amer: 71 mL/min/{1.73_m2} (ref >59)
GLOBULIN, TOTAL: 3.5 g/dL (ref 1.5–4.5)
Glucose: 94 mg/dL (ref 65–99)
POTASSIUM: 4.7 mmol/L (ref 3.5–5.2)
Sodium: 139 mmol/L (ref 134–144)
Total Protein: 6.8 g/dL (ref 6.0–8.5)

## 2017-02-22 LAB — LIPID PANEL W/O CHOL/HDL RATIO
Cholesterol, Total: 82 mg/dL — ABNORMAL LOW (ref 100–199)
HDL: 47 mg/dL (ref >39)
LDL CALC: 25 mg/dL (ref 0–99)
TRIGLYCERIDES: 48 mg/dL (ref 0–149)
VLDL Cholesterol Cal: 10 mg/dL (ref 5–40)

## 2017-02-22 LAB — T4, FREE: Free T4: 1.23 ng/dL (ref 0.82–1.77)

## 2017-02-22 LAB — HGB A1C W/O EAG: Hgb A1c MFr Bld: 8.4 % — ABNORMAL HIGH (ref 4.8–5.6)

## 2017-02-22 LAB — TSH: TSH: 4.42 u[IU]/mL (ref 0.450–4.500)

## 2017-02-22 LAB — MICROALBUMIN / CREATININE URINE RATIO
Creatinine, Urine: 86.5 mg/dL
Microalb/Creat Ratio: 69.8 mg/g creat — ABNORMAL HIGH (ref 0.0–30.0)
Microalbumin, Urine: 60.4 ug/mL

## 2017-02-23 ENCOUNTER — Encounter: Payer: Self-pay | Admitting: "Endocrinology

## 2017-02-23 ENCOUNTER — Ambulatory Visit (INDEPENDENT_AMBULATORY_CARE_PROVIDER_SITE_OTHER): Payer: Medicare Other | Admitting: "Endocrinology

## 2017-02-23 VITALS — BP 130/67 | HR 66 | Ht 68.0 in | Wt 209.0 lb

## 2017-02-23 DIAGNOSIS — E1165 Type 2 diabetes mellitus with hyperglycemia: Secondary | ICD-10-CM | POA: Diagnosis not present

## 2017-02-23 DIAGNOSIS — I10 Essential (primary) hypertension: Secondary | ICD-10-CM | POA: Diagnosis not present

## 2017-02-23 DIAGNOSIS — Z794 Long term (current) use of insulin: Secondary | ICD-10-CM | POA: Diagnosis not present

## 2017-02-23 DIAGNOSIS — E1169 Type 2 diabetes mellitus with other specified complication: Secondary | ICD-10-CM

## 2017-02-23 DIAGNOSIS — IMO0002 Reserved for concepts with insufficient information to code with codable children: Secondary | ICD-10-CM

## 2017-02-23 MED ORDER — INSULIN DETEMIR 100 UNIT/ML FLEXPEN: 40.0000 [IU] | PEN_INJECTOR | Freq: Every day | SUBCUTANEOUS | 2 refills | Status: DC

## 2017-02-23 NOTE — Progress Notes (Signed)
Subjective:    Patient ID: Caleb Riley, male    DOB: 03/06/1951. Patient is being seen in consultation for management of diabetes requested by  Vidal Schwalbe, MD  Past Medical History:  Diagnosis Date  . Diabetes mellitus without complication ([place])   . Diarrhea, travelers'   . GERD (gastroesophageal reflux disease)   . Hypertension   . Staphylococcus aureus bacteremia    Past Surgical History:  Procedure Laterality Date  . i and d    . SKIN GRAFT     Social History   Social History  . Marital status: Single    Spouse name: N/A  . Number of children: N/A  . Years of education: N/A   Social History Main Topics  . Smoking status: Never Smoker  . Smokeless tobacco: Never Used  . Alcohol use No     Comment: former alcoholic  . Drug use: No  . Sexual activity: Not Currently   Other Topics Concern  . None   Social History Narrative   ** Merged History Encounter **       Outpatient Encounter Prescriptions as of 02/23/2017  Medication Sig  . atorvastatin (LIPITOR) 10 MG tablet Take 10 mg by mouth daily.  . insulin aspart (NOVOLOG FLEXPEN) 100 UNIT/ML FlexPen Inject 10-16 Units into the skin 3 (three) times daily with meals.  . Insulin Detemir (LEVEMIR FLEXPEN) 100 UNIT/ML Pen Inject 40 Units into the skin daily at 10 pm.  . lactulose (CHRONULAC) 10 GM/15ML solution Take 30 g by mouth 2 (two) times daily.  Marland Kitchen omeprazole (PRILOSEC) 20 MG capsule Take 20 mg by mouth daily.  . propranolol (INDERAL) 20 MG tablet Take 1 tablet (20 mg total) by mouth 2 (two) times daily.  . rifaximin (XIFAXAN) 550 MG TABS tablet Take 550 mg by mouth 2 (two) times daily.  Marland Kitchen spironolactone (ALDACTONE) 25 MG tablet Take 25 mg by mouth daily.  . tamsulosin (FLOMAX) 0.4 MG CAPS capsule Take 0.4 mg by mouth daily.  . Vitamin D, Ergocalciferol, (DRISDOL) 50000 units CAPS capsule Take 50,000 Units by mouth every 7 (seven) days.  . [DISCONTINUED] Insulin Detemir (LEVEMIR FLEXPEN) 100 UNIT/ML Pen Inject  35 Units into the skin daily at 10 pm.   No facility-administered encounter medications on file as of 02/23/2017.    ALLERGIES: No Known Allergies VACCINATION STATUS: Immunization History  Administered Date(s) Administered  . Influenza,inj,Quad PF,36+ Mos 10/14/2013    Diabetes  He presents for his follow-up diabetic visit. He has type 2 diabetes mellitus. Onset time: He was diagnosed at approximate age of 61 years. His disease course has been improving. There are no hypoglycemic associated symptoms. Pertinent negatives for hypoglycemia include no confusion, headaches, pallor or seizures. Associated symptoms include blurred vision, polydipsia and polyuria. Pertinent negatives for diabetes include no chest pain, no fatigue, no polyphagia and no weakness. There are no hypoglycemic complications. Symptoms are improving. Diabetic complications include peripheral neuropathy and PVD. Risk factors for coronary artery disease include diabetes mellitus, dyslipidemia, hypertension, male sex, sedentary lifestyle and tobacco exposure. Current diabetic treatment includes intensive insulin program. His weight is fluctuating minimally. He is following a generally unhealthy diet. When asked about meal planning, he reported none. He has not had a previous visit with a dietitian. He never participates in exercise. His breakfast blood glucose range is generally 180-200 mg/dl. His lunch blood glucose range is generally >200 mg/dl. His dinner blood glucose range is generally >200 mg/dl. His bedtime blood glucose range is generally >200  mg/dl. His overall blood glucose range is >200 mg/dl. (He did not bring any meter nor logs to review today. He admits he does not monitor blood glucose regularly. His most recent A1c was 11.9%.)  Hypertension  This is a chronic problem. The current episode started more than 1 year ago. The problem is controlled. Associated symptoms include blurred vision. Pertinent negatives include no chest  pain, headaches, neck pain, palpitations or shortness of breath. Risk factors for coronary artery disease include smoking/tobacco exposure, sedentary lifestyle, dyslipidemia, diabetes mellitus, obesity and male gender. Hypertensive end-organ damage includes PVD.  Hyperlipidemia  This is a chronic problem. The current episode started more than 1 year ago. Exacerbating diseases include diabetes and obesity. Associated symptoms include myalgias. Pertinent negatives include no chest pain or shortness of breath. Current antihyperlipidemic treatment includes statins. Risk factors for coronary artery disease include diabetes mellitus, dyslipidemia, hypertension, male sex, a sedentary lifestyle and obesity.    Review of Systems  Constitutional: Negative for chills, fatigue, fever and unexpected weight change.  HENT: Negative for dental problem, mouth sores and trouble swallowing.   Eyes: Positive for blurred vision. Negative for visual disturbance.  Respiratory: Negative for cough, choking, chest tightness, shortness of breath and wheezing.   Cardiovascular: Positive for leg swelling. Negative for chest pain and palpitations.  Gastrointestinal: Negative for abdominal distention, abdominal pain, constipation, diarrhea, nausea and vomiting.  Endocrine: Positive for polydipsia and polyuria. Negative for polyphagia.  Genitourinary: Negative for dysuria, flank pain, hematuria and urgency.  Musculoskeletal: Positive for gait problem and myalgias. Negative for back pain and neck pain.  Skin: Negative for pallor, rash and wound.  Neurological: Negative for seizures, syncope, weakness, numbness and headaches.  Psychiatric/Behavioral: Negative.  Negative for confusion and dysphoric mood.    Objective:    BP 130/67   Pulse 66   Ht 5\' 8"  (1.727 m)   Wt 209 lb (94.8 kg)   BMI 31.78 kg/m   Wt Readings from Last 3 Encounters:  02/23/17 209 lb (94.8 kg)  12/14/16 202 lb (91.6 kg)  11/17/13 232 lb (105.2 kg)     Physical Exam  Constitutional: He is oriented to person, place, and time. He appears well-developed and well-nourished. He is cooperative. No distress.  HENT:  Head: Normocephalic and atraumatic.  Eyes: EOM are normal.  Neck: Normal range of motion. Neck supple. No tracheal deviation present. No thyromegaly present.  Cardiovascular: Normal rate, S1 normal, S2 normal and normal heart sounds.  Exam reveals no gallop.   No murmur heard. Pulses:      Dorsalis pedis pulses are 1+ on the right side, and 1+ on the left side.       Posterior tibial pulses are 1+ on the right side, and 1+ on the left side.  Pulmonary/Chest: Breath sounds normal. No respiratory distress. He has no wheezes.  Abdominal: Soft. Bowel sounds are normal. He exhibits no distension. There is no tenderness. There is no guarding and no CVA tenderness.  Musculoskeletal: He exhibits no edema.       Right shoulder: He exhibits no swelling and no deformity.  Old scar on the left lower extremity which has significant deformity from prior injuries.  Neurological: He is alert and oriented to person, place, and time. He has normal strength and normal reflexes. No cranial nerve deficit or sensory deficit. Gait normal.  Skin: Skin is warm and dry. No rash noted. No cyanosis. Nails show no clubbing.  Psychiatric: He has a normal mood and affect. His  speech is normal. Cognition and memory are normal.     CMP ( most recent) CMP     Component Value Date/Time   NA 139 02/21/2017 0836   K 4.7 02/21/2017 0836   CL 110 (H) 02/21/2017 0836   CO2 18 02/21/2017 0836   GLUCOSE 94 02/21/2017 0836   GLUCOSE 27 (LL) 01/31/2014 2100   BUN 17 02/21/2017 0836   CREATININE 1.10 02/21/2017 0836   CALCIUM 8.2 (L) 02/21/2017 0836   PROT 6.8 02/21/2017 0836   ALBUMIN 3.3 (L) 02/21/2017 0836   AST 27 02/21/2017 0836   ALT 27 02/21/2017 0836   ALKPHOS 124 (H) 02/21/2017 0836   BILITOT 1.1 02/21/2017 0836   GFRNONAA 71 02/21/2017 0836    GFRAA 82 02/21/2017 0836     Diabetic Labs (most recent): Lab Results  Component Value Date   HGBA1C 8.4 (H) 02/21/2017   HGBA1C 11.9 11/09/2016   HGBA1C 8.3 (H) 09/19/2013     Assessment & Plan:   1. Uncontrolled type 2 diabetes mellitus with other specified complication, with long-term current use of insulin ([place])  - Patient has currently uncontrolled symptomatic type 2 DM since  66 years of age. - He came with improving glycemic profile despite his dosing error in NovoLog. His A1c has improved to 8.4% from 11.9%. Recent labs reviewed.   He has a history of alcoholic cirrhosis, he is diabetes is complicated by peripheral arterial disease, sedentary life and patient remains at a high risk for more acute and chronic complications of diabetes which include CAD, CVA, CKD, retinopathy, and neuropathy. These are all discussed in detail with the patient.  - I have counseled the patient on diet management and weight loss, by adopting a carbohydrate restricted/protein rich diet.  - Suggestion is made for patient to avoid simple carbohydrates   from his diet including Cakes , Desserts, Ice Cream,  Soda (  diet and regular) , Sweet Tea , Candies,  Chips, Cookies, Artificial Sweeteners,   and "Sugar-free" Products . This will help patient to have stable blood glucose profile and potentially avoid unintended weight gain.  - I encouraged the patient to switch to  unprocessed or minimally processed complex starch and increased protein intake (animal or plant source), fruits, and vegetables.  - Patient is advised to stick to a routine mealtimes to eat 3 meals  a day and avoid unnecessary snacks ( to snack only to correct hypoglycemia).  - The patient will be scheduled with Jearld Fenton, RDN, CDE for individualized DM education.  - I have approached patient with the following individualized plan to manage diabetes and patient agrees:   - Due to his alcohol history with cirrhosis, he does not have  suitable options for oral medications to treat diabetes.  - He will continue to require insulin in the basal/bolus schedule -  I  will increase his basal insulin Levemir to 40 units daily at bedtime, NovoLog 10 units 3 times a day before meals for pre-meal BG readings of 90-150mg /dl, plus patient specific correction dose for unexpected hyperglycemia above 150mg /dl, associated with strict monitoring of glucose  AC and HS. - Patient is warned not to take insulin without proper monitoring per orders. -Adjustment parameters are given for hypo and hyperglycemia in writing. -Patient is encouraged to call clinic for blood glucose levels less than 70 or above 300 mg /dl.  -Patient is not a candidate for metformin, incretin therapy due to cirrhosis and history of alcoholism respectively.   - Patient  specific target  A1c;  LDL, HDL, Triglycerides, and  Waist Circumference were discussed in detail.  2)  HTN:  Controlled. continue current medications including spironolactone 25 mg by mouth daily  3) Lipids: control unknown .  Patient is advised tocontinue statins. 4)  Weight/Diet: CDE Consult has been  initiated , exercise, and detailed carbohydrates information provided.  5) Chronic Care/Health Maintenance:  -Patient  Is on Statin medications and encouraged to continue to follow up with Ophthalmology, Podiatrist at least yearly or according to recommendations, and advised to  y away from smoking. I have recommended yearly flu vaccine and pneumonia vaccination at least every 5 years; moderate intensity exercise for up to 150 minutes weekly; and  sleep for at least 7 hours a day.  - 60 minutes of time was spent on the care of this patient , 50% of which was applied for counseling on diabetes complications and their preventions.  - Patient to bring meter and  blood glucose logs during his next visit.   - I advised patient to maintain close follow up with Vidal Schwalbe, MD for primary care needs.  Follow  up plan: - Return in about 3 months (around 05/26/2017) for meter, and logs.  Glade Lloyd, MD Phone: [phone]  Fax: [phone]   02/23/2017, 12:43 PM

## 2017-02-23 NOTE — Patient Instructions (Signed)

## 2017-04-19 ENCOUNTER — Other Ambulatory Visit: Payer: Self-pay | Admitting: "Endocrinology

## 2017-05-19 ENCOUNTER — Other Ambulatory Visit: Payer: Self-pay | Admitting: "Endocrinology

## 2017-05-29 ENCOUNTER — Ambulatory Visit: Payer: Medicare Other | Admitting: "Endocrinology

## 2017-06-20 ENCOUNTER — Ambulatory Visit: Payer: Medicare Other | Admitting: "Endocrinology

## 2017-06-21 ENCOUNTER — Other Ambulatory Visit: Payer: Self-pay | Admitting: "Endocrinology

## 2017-06-22 LAB — HGB A1C W/O EAG: Hgb A1c MFr Bld: 7.3 % — ABNORMAL HIGH (ref 4.8–5.6)

## 2017-06-22 LAB — RENAL FUNCTION PANEL
Albumin: 3.5 g/dL — ABNORMAL LOW (ref 3.6–4.8)
BUN / CREAT RATIO: 21 (ref 10–24)
BUN: 25 mg/dL (ref 8–27)
CO2: 18 mmol/L — ABNORMAL LOW (ref 20–29)
Calcium: 8.3 mg/dL — ABNORMAL LOW (ref 8.6–10.2)
Chloride: 109 mmol/L — ABNORMAL HIGH (ref 96–106)
Creatinine, Ser: 1.2 mg/dL (ref 0.76–1.27)
GFR, EST AFRICAN AMERICAN: 73 mL/min/{1.73_m2} (ref >59)
GFR, EST NON AFRICAN AMERICAN: 64 mL/min/{1.73_m2} (ref >59)
Glucose: 225 mg/dL — ABNORMAL HIGH (ref 65–99)
Phosphorus: 3.6 mg/dL (ref 2.5–4.5)
Potassium: 4.8 mmol/L (ref 3.5–5.2)
Sodium: 139 mmol/L (ref 134–144)

## 2017-07-06 ENCOUNTER — Encounter: Payer: Self-pay | Admitting: "Endocrinology

## 2017-07-06 ENCOUNTER — Ambulatory Visit (INDEPENDENT_AMBULATORY_CARE_PROVIDER_SITE_OTHER): Payer: Medicare Other | Admitting: "Endocrinology

## 2017-07-06 VITALS — BP 134/75 | HR 52 | Ht 68.0 in | Wt 203.0 lb

## 2017-07-06 DIAGNOSIS — E1165 Type 2 diabetes mellitus with hyperglycemia: Secondary | ICD-10-CM | POA: Diagnosis not present

## 2017-07-06 DIAGNOSIS — E782 Mixed hyperlipidemia: Secondary | ICD-10-CM

## 2017-07-06 DIAGNOSIS — I10 Essential (primary) hypertension: Secondary | ICD-10-CM

## 2017-07-06 MED ORDER — FREESTYLE LIBRE SENSOR SYSTEM MISC: 2 refills | Status: AC

## 2017-07-06 MED ORDER — FREESTYLE LIBRE READER DEVI: 1.0000 | Freq: Once | 0 refills | Status: AC

## 2017-07-06 NOTE — Patient Instructions (Signed)

## 2017-07-06 NOTE — Progress Notes (Signed)
Subjective:    Patient ID: Caleb Riley, male    DOB: 26-Mar-1951. Patient is being seen in f/u for management of diabetes requested by  Vidal Schwalbe, MD  Past Medical History:  Diagnosis Date  . Diabetes mellitus without complication ([place])   . Diarrhea, travelers'   . GERD (gastroesophageal reflux disease)   . Hypertension   . Staphylococcus aureus bacteremia    Past Surgical History:  Procedure Laterality Date  . i and d    . SKIN GRAFT     Social History   Social History  . Marital status: Single    Spouse name: N/A  . Number of children: N/A  . Years of education: N/A   Social History Main Topics  . Smoking status: Never Smoker  . Smokeless tobacco: Never Used  . Alcohol use No     Comment: former alcoholic  . Drug use: No  . Sexual activity: Not Currently   Other Topics Concern  . None   Social History Narrative   ** Merged History Encounter **       Outpatient Encounter Prescriptions as of 07/06/2017  Medication Sig  . atorvastatin (LIPITOR) 10 MG tablet Take 10 mg by mouth daily.  . Continuous Blood Gluc Receiver (FREESTYLE LIBRE READER) DEVI 1 Piece by Does not apply route once.  . Continuous Blood Gluc Sensor (FREESTYLE LIBRE SENSOR SYSTEM) MISC Use one sensor every 10 days.  . Insulin Detemir (LEVEMIR FLEXTOUCH) 100 UNIT/ML Pen Inject 40 Units into the skin at bedtime.  Marland Kitchen lactulose (CHRONULAC) 10 GM/15ML solution Take 30 g by mouth 2 (two) times daily.  Marland Kitchen NOVOLOG FLEXPEN 100 UNIT/ML FlexPen INJECT 10-16 UNITS SUBCUTANEOUSLY THREE TIMES DAILY WITH MEALS.  Marland Kitchen omeprazole (PRILOSEC) 20 MG capsule Take 20 mg by mouth daily.  . propranolol (INDERAL) 20 MG tablet Take 1 tablet (20 mg total) by mouth 2 (two) times daily.  . rifaximin (XIFAXAN) 550 MG TABS tablet Take 550 mg by mouth 2 (two) times daily.  Marland Kitchen spironolactone (ALDACTONE) 25 MG tablet Take 25 mg by mouth daily.  . tamsulosin (FLOMAX) 0.4 MG CAPS capsule Take 0.4 mg by mouth daily.  . Vitamin D,  Ergocalciferol, (DRISDOL) 50000 units CAPS capsule Take 50,000 Units by mouth every 7 (seven) days.  . [DISCONTINUED] Insulin Detemir (LEVEMIR FLEXPEN) 100 UNIT/ML Pen Inject 40 Units into the skin daily at 10 pm.   No facility-administered encounter medications on file as of 07/06/2017.    ALLERGIES: No Known Allergies VACCINATION STATUS: Immunization History  Administered Date(s) Administered  . Influenza,inj,Quad PF,6+ Mos 10/14/2013    Diabetes  He presents for his follow-up diabetic visit. He has type 2 diabetes mellitus. Onset time: He was diagnosed at approximate age of 48 years. His disease course has been improving. There are no hypoglycemic associated symptoms. Pertinent negatives for hypoglycemia include no confusion, headaches, pallor or seizures. Associated symptoms include blurred vision, polydipsia and polyuria. Pertinent negatives for diabetes include no chest pain, no fatigue, no polyphagia and no weakness. There are no hypoglycemic complications. Symptoms are improving. Diabetic complications include peripheral neuropathy and PVD. Risk factors for coronary artery disease include diabetes mellitus, dyslipidemia, hypertension, male sex, sedentary lifestyle and tobacco exposure. Current diabetic treatment includes intensive insulin program. His weight is fluctuating minimally. He is following a generally unhealthy diet. When asked about meal planning, he reported none. He has not had a previous visit with a dietitian. He never participates in exercise. His breakfast blood glucose range is generally >  200 mg/dl. His lunch blood glucose range is generally >200 mg/dl. His dinner blood glucose range is generally >200 mg/dl. His bedtime blood glucose range is generally >200 mg/dl. His overall blood glucose range is >200 mg/dl.  Hypertension  This is a chronic problem. The current episode started more than 1 year ago. The problem is controlled. Associated symptoms include blurred vision.  Pertinent negatives include no chest pain, headaches, neck pain, palpitations or shortness of breath. Risk factors for coronary artery disease include smoking/tobacco exposure, sedentary lifestyle, dyslipidemia, diabetes mellitus, obesity and male gender. Hypertensive end-organ damage includes PVD.  Hyperlipidemia  This is a chronic problem. The current episode started more than 1 year ago. Exacerbating diseases include diabetes and obesity. Associated symptoms include myalgias. Pertinent negatives include no chest pain or shortness of breath. Current antihyperlipidemic treatment includes statins. Risk factors for coronary artery disease include diabetes mellitus, dyslipidemia, hypertension, male sex, a sedentary lifestyle and obesity.    Review of Systems  Constitutional: Negative for chills, fatigue, fever and unexpected weight change.  HENT: Negative for dental problem, mouth sores and trouble swallowing.   Eyes: Positive for blurred vision. Negative for visual disturbance.  Respiratory: Negative for cough, choking, chest tightness, shortness of breath and wheezing.   Cardiovascular: Positive for leg swelling. Negative for chest pain and palpitations.  Gastrointestinal: Negative for abdominal distention, abdominal pain, constipation, diarrhea, nausea and vomiting.  Endocrine: Positive for polydipsia and polyuria. Negative for polyphagia.  Genitourinary: Negative for dysuria, flank pain, hematuria and urgency.  Musculoskeletal: Positive for gait problem and myalgias. Negative for back pain and neck pain.  Skin: Negative for pallor, rash and wound.  Neurological: Negative for seizures, syncope, weakness, numbness and headaches.  Psychiatric/Behavioral: Negative.  Negative for confusion and dysphoric mood.    Objective:    BP 134/75   Pulse (!) 52   Ht 5\' 8"  (1.727 m)   Wt 203 lb (92.1 kg)   BMI 30.87 kg/m   Wt Readings from Last 3 Encounters:  07/06/17 203 lb (92.1 kg)  02/23/17 209 lb  (94.8 kg)  12/14/16 202 lb (91.6 kg)    Physical Exam  Constitutional: He is oriented to person, place, and time. He appears well-developed and well-nourished. He is cooperative. No distress.  HENT:  Head: Normocephalic and atraumatic.  Eyes: EOM are normal.  Neck: Normal range of motion. Neck supple. No tracheal deviation present. No thyromegaly present.  Cardiovascular: Normal rate, S1 normal, S2 normal and normal heart sounds.  Exam reveals no gallop.   No murmur heard. Pulses:      Dorsalis pedis pulses are 1+ on the right side, and 1+ on the left side.       Posterior tibial pulses are 1+ on the right side, and 1+ on the left side.  Pulmonary/Chest: Breath sounds normal. No respiratory distress. He has no wheezes.  Abdominal: Soft. Bowel sounds are normal. He exhibits no distension. There is no tenderness. There is no guarding and no CVA tenderness.  Musculoskeletal: He exhibits no edema.       Right shoulder: He exhibits no swelling and no deformity.  Old scar on the left lower extremity which has significant deformity from prior injuries.  Neurological: He is alert and oriented to person, place, and time. He has normal strength and normal reflexes. No cranial nerve deficit or sensory deficit. Gait normal.  Skin: Skin is warm and dry. No rash noted. No cyanosis. Nails show no clubbing.  Psychiatric: He has a normal mood  and affect. His speech is normal. Cognition and memory are normal.     CMP ( most recent) CMP     Component Value Date/Time   NA 139 06/21/2017 1027   K 4.8 06/21/2017 1027   CL 109 (H) 06/21/2017 1027   CO2 18 (L) 06/21/2017 1027   GLUCOSE 225 (H) 06/21/2017 1027   GLUCOSE 27 (LL) 01/31/2014 2100   BUN 25 06/21/2017 1027   CREATININE 1.20 06/21/2017 1027   CALCIUM 8.3 (L) 06/21/2017 1027   PROT 6.8 02/21/2017 0836   ALBUMIN 3.5 (L) 06/21/2017 1027   AST 27 02/21/2017 0836   ALT 27 02/21/2017 0836   ALKPHOS 124 (H) 02/21/2017 0836   BILITOT 1.1  02/21/2017 0836   GFRNONAA 64 06/21/2017 1027   GFRAA 73 06/21/2017 1027     Diabetic Labs (most recent): Lab Results  Component Value Date   HGBA1C 7.3 (H) 06/21/2017   HGBA1C 8.4 (H) 02/21/2017   HGBA1C 11.9 11/09/2016   Lipid Panel     Component Value Date/Time   CHOL 82 (L) 02/21/2017 0836   TRIG 48 02/21/2017 0836   HDL 47 02/21/2017 0836   LDLCALC 25 02/21/2017 0836    Assessment & Plan:   1. Uncontrolled type 2 diabetes mellitus with other specified complication, with long-term current use of insulin ([place])  - Patient has currently uncontrolled symptomatic type 2 DM since  66 years of age. -  His A1c has improved to 7.3%, Improving from 11.9%. However most recently he is skipping at least half of his prandial insulin opportunities and his recent blood glucose readings are significantly above target.  Recent labs reviewed.   He has a history of alcoholic cirrhosis, he is diabetes is complicated by peripheral arterial disease, sedentary life and patient remains at a high risk for more acute and chronic complications of diabetes which include CAD, CVA, CKD, retinopathy, and neuropathy. These are all discussed in detail with the patient.  - I have counseled the patient on diet management and weight loss, by adopting a carbohydrate restricted/protein rich diet.  -  Suggestion is made for him to avoid simple carbohydrates  from his diet including Cakes, Sweet Desserts / Pastries, Ice Cream, Soda (diet and regular), Sweet Tea, Candies, Chips, Cookies, Store Bought Juices, Alcohol in Excess of  1-2 drinks a day, Artificial Sweeteners, and "Sugar-free" Products. This will help patient to have stable blood glucose profile and potentially avoid unintended weight gain.   - I encouraged the patient to switch to  unprocessed or minimally processed complex starch and increased protein intake (animal or plant source), fruits, and vegetables.  - Patient is advised to stick to a routine  mealtimes to eat 3 meals  a day and avoid unnecessary snacks ( to snack only to correct hypoglycemia).    - I have approached patient with the following individualized plan to manage diabetes and patient agrees:   - Due to his alcohol history with cirrhosis, he does not have suitable options for oral medications to treat diabetes.  - He will continue to require insulin in the basal/bolus schedule -  I  will continue his basal insulin Levemir  40 units daily at bedtime, NovoLog 10 units 3 times a day before meals for pre-meal blood glucose readings of 90-150mg /dl, plus patient specific correction dose for unexpected hyperglycemia above 150mg /dl, associated with strict monitoring of glucose  AC and HS. - Patient is warned not to take insulin without proper monitoring per orders. -Adjustment parameters  are given for hypo and hyperglycemia in writing. -Patient is encouraged to call clinic for blood glucose levels less than 70 or above 300 mg /dl.  -Patient is not a candidate for metformin, incretin therapy due to cirrhosis and history of alcoholism respectively.   - Patient specific target  A1c;  LDL, HDL, Triglycerides, and  Waist Circumference were discussed in detail.  2)  HTN:  Controlled. continue current medications including spironolactone 25 mg by mouth daily  3) Lipids: controlled with LDL of 25.  Given his history of alcoholic cirrhosis, he had advised to discontinue atorvastatin.   4)  Weight/Diet: CDE Consult has been  initiated , exercise, and detailed carbohydrates information provided.  5) Chronic Care/Health Maintenance:  -Patient  Is on Statin medications and encouraged to continue to follow up with Ophthalmology, Podiatrist at least yearly or according to recommendations, and advised to  y away from smoking. I have recommended yearly flu vaccine and pneumonia vaccination at least every 5 years; moderate intensity exercise for up to 150 minutes weekly; and  sleep for at least 7  hours a day.  - Time spent with the patient: 25 min, of which >50% was spent in reviewing his sugar logs , discussing his hypo- and hyper-glycemic episodes, reviewing his current and  previous labs and insulin doses and developing a plan to avoid hypo- and hyper-glycemia.   - I advised patient to maintain close follow up with Vidal Schwalbe, MD for primary care needs.  Follow up plan: - Return in about 3 months (around 10/06/2017) for follow up with pre-visit labs, meter, and logs.  Glade Lloyd, MD Phone: [phone]  Fax: [phone]  -  This note was partially dictated with voice recognition software. Similar sounding words can be transcribed inadequately or may not  be corrected upon review.  07/06/2017, 9:23 AM

## 2017-07-20 ENCOUNTER — Other Ambulatory Visit: Payer: Self-pay | Admitting: "Endocrinology

## 2017-08-09 ENCOUNTER — Ambulatory Visit (INDEPENDENT_AMBULATORY_CARE_PROVIDER_SITE_OTHER): Payer: Medicare Other | Admitting: "Endocrinology

## 2017-08-09 ENCOUNTER — Encounter: Payer: Self-pay | Admitting: "Endocrinology

## 2017-08-09 VITALS — BP 138/71 | HR 67 | Ht 68.0 in | Wt 206.0 lb

## 2017-08-09 DIAGNOSIS — E1165 Type 2 diabetes mellitus with hyperglycemia: Secondary | ICD-10-CM | POA: Insufficient documentation

## 2017-08-09 DIAGNOSIS — I10 Essential (primary) hypertension: Secondary | ICD-10-CM

## 2017-08-09 DIAGNOSIS — L02416 Cutaneous abscess of left lower limb: Secondary | ICD-10-CM

## 2017-08-09 DIAGNOSIS — E782 Mixed hyperlipidemia: Secondary | ICD-10-CM

## 2017-08-09 NOTE — Progress Notes (Signed)
Subjective:    Patient ID: Caleb Riley, male    DOB: 01/22/1951. Patient is being seen in f/u for management of diabetes requested by  Vidal Schwalbe, MD  Past Medical History:  Diagnosis Date  . Diabetes mellitus without complication ([place])   . Diarrhea, travelers'   . GERD (gastroesophageal reflux disease)   . Hypertension   . Staphylococcus aureus bacteremia    Past Surgical History:  Procedure Laterality Date  . i and d    . SKIN GRAFT     Social History   Socioeconomic History  . Marital status: Single    Spouse name: None  . Number of children: None  . Years of education: None  . Highest education level: None  Social Needs  . Financial resource strain: None  . Food insecurity - worry: None  . Food insecurity - inability: None  . Transportation needs - medical: None  . Transportation needs - non-medical: None  Occupational History  . None  Tobacco Use  . Smoking status: Never Smoker  . Smokeless tobacco: Never Used  Substance and Sexual Activity  . Alcohol use: No    Comment: former alcoholic  . Drug use: No  . Sexual activity: Not Currently  Other Topics Concern  . None  Social History Narrative   ** Merged History Encounter **       Outpatient Encounter Medications as of 08/09/2017  Medication Sig  . sulfamethoxazole-trimethoprim (BACTRIM DS,SEPTRA DS) 800-160 MG tablet Take 1 tablet 2 (two) times daily by mouth.  . Continuous Blood Gluc Sensor (FREESTYLE LIBRE SENSOR SYSTEM) MISC Use one sensor every 10 days.  Marland Kitchen lactulose (CHRONULAC) 10 GM/15ML solution Take 30 g by mouth 2 (two) times daily.  Marland Kitchen LEVEMIR FLEXTOUCH 100 UNIT/ML Pen INJECT 40 UNITS S.Q. ONCE DAILY AT 10 P.M.  . NOVOLOG FLEXPEN 100 UNIT/ML FlexPen INJECT 10-16 UNITS SUBCUTANEOUSLY THREE TIMES DAILY WITH MEALS.  Marland Kitchen omeprazole (PRILOSEC) 20 MG capsule Take 20 mg by mouth daily.  . propranolol (INDERAL) 20 MG tablet Take 1 tablet (20 mg total) by mouth 2 (two) times daily.  . rifaximin  (XIFAXAN) 550 MG TABS tablet Take 550 mg by mouth 2 (two) times daily.  Marland Kitchen spironolactone (ALDACTONE) 25 MG tablet Take 25 mg by mouth daily.  . tamsulosin (FLOMAX) 0.4 MG CAPS capsule Take 0.4 mg by mouth daily.  . Vitamin D, Ergocalciferol, (DRISDOL) 50000 units CAPS capsule Take 50,000 Units by mouth every 7 (seven) days.   No facility-administered encounter medications on file as of 08/09/2017.    ALLERGIES: No Known Allergies VACCINATION STATUS: Immunization History  Administered Date(s) Administered  . Influenza,inj,Quad PF,6+ Mos 10/14/2013    Diabetes  He presents for his follow-up diabetic visit. He has type 2 diabetes mellitus. Onset time: He was diagnosed at approximate age of 35 years. His disease course has been worsening. There are no hypoglycemic associated symptoms. Pertinent negatives for hypoglycemia include no confusion, headaches, pallor or seizures. Associated symptoms include blurred vision, polydipsia and polyuria. Pertinent negatives for diabetes include no chest pain, no fatigue, no polyphagia and no weakness. There are no hypoglycemic complications. Symptoms are worsening. Diabetic complications include peripheral neuropathy and PVD. Risk factors for coronary artery disease include diabetes mellitus, dyslipidemia, hypertension, male sex, sedentary lifestyle and tobacco exposure. Current diabetic treatment includes intensive insulin program. His weight is fluctuating minimally. He is following a generally unhealthy diet. When asked about meal planning, he reported none. He has not had a previous visit with  a dietitian. He never participates in exercise. His home blood glucose trend is increasing rapidly. His breakfast blood glucose range is generally >200 mg/dl. His lunch blood glucose range is generally >200 mg/dl. His dinner blood glucose range is generally >200 mg/dl. His bedtime blood glucose range is generally >200 mg/dl. His overall blood glucose range is >200 mg/dl.  (Average blood glucose in the last 7 days is 407 related to an abscess on his left  Leg.)  Hypertension  This is a chronic problem. The current episode started more than 1 year ago. The problem is controlled. Associated symptoms include blurred vision. Pertinent negatives include no chest pain, headaches, neck pain, palpitations or shortness of breath. Risk factors for coronary artery disease include smoking/tobacco exposure, sedentary lifestyle, dyslipidemia, diabetes mellitus, obesity and male gender. Hypertensive end-organ damage includes PVD.  Hyperlipidemia  This is a chronic problem. The current episode started more than 1 year ago. Exacerbating diseases include diabetes and obesity. Associated symptoms include myalgias. Pertinent negatives include no chest pain or shortness of breath. Current antihyperlipidemic treatment includes statins. Risk factors for coronary artery disease include diabetes mellitus, dyslipidemia, hypertension, male sex, a sedentary lifestyle and obesity.    Review of Systems  Constitutional: Negative for chills, fatigue, fever and unexpected weight change.  HENT: Negative for dental problem, mouth sores and trouble swallowing.   Eyes: Positive for blurred vision. Negative for visual disturbance.  Respiratory: Negative for cough, choking, chest tightness, shortness of breath and wheezing.   Cardiovascular: Positive for leg swelling. Negative for chest pain and palpitations.  Gastrointestinal: Negative for abdominal distention, abdominal pain, constipation, diarrhea, nausea and vomiting.  Endocrine: Positive for polydipsia and polyuria. Negative for polyphagia.  Genitourinary: Negative for dysuria, flank pain, hematuria and urgency.  Musculoskeletal: Positive for gait problem and myalgias. Negative for back pain and neck pain.  Skin: Negative for pallor, rash and wound.  Neurological: Negative for seizures, syncope, weakness, numbness and headaches.   Psychiatric/Behavioral: Negative.  Negative for confusion and dysphoric mood.    Objective:    BP 138/71   Pulse 67   Ht 5\' 8"  (1.727 m)   Wt 206 lb (93.4 kg)   BMI 31.32 kg/m   Wt Readings from Last 3 Encounters:  08/09/17 206 lb (93.4 kg)  07/06/17 203 lb (92.1 kg)  02/23/17 209 lb (94.8 kg)    Physical Exam  Constitutional: He is oriented to person, place, and time. He appears well-developed and well-nourished. He is cooperative. No distress.  HENT:  Head: Normocephalic and atraumatic.  Eyes: EOM are normal.  Neck: Normal range of motion. Neck supple. No tracheal deviation present. No thyromegaly present.  Cardiovascular: Normal rate, S1 normal, S2 normal and normal heart sounds. Exam reveals no gallop.  No murmur heard. Pulses:      Dorsalis pedis pulses are 1+ on the right side, and 1+ on the left side.       Posterior tibial pulses are 1+ on the right side, and 1+ on the left side.  Pulmonary/Chest: Breath sounds normal. No respiratory distress. He has no wheezes.  Abdominal: Soft. Bowel sounds are normal. He exhibits no distension. There is no tenderness. There is no guarding and no CVA tenderness.  Musculoskeletal: He exhibits no edema.       Right shoulder: He exhibits no swelling and no deformity.  3 weeks ago he developed abscess on the left lower extremity for which he has seen his PCP currently on antibiotic treatment. - Dressed ulcer, which still  appears to have some collection.  Neurological: He is alert and oriented to person, place, and time. He has normal strength and normal reflexes. No cranial nerve deficit or sensory deficit. Gait normal.  Skin: Skin is warm and dry. No rash noted. No cyanosis. Nails show no clubbing.  Psychiatric: He has a normal mood and affect. His speech is normal. Cognition and memory are normal.     CMP ( most recent) CMP     Component Value Date/Time   NA 139 06/21/2017 1027   K 4.8 06/21/2017 1027   CL 109 (H) 06/21/2017  1027   CO2 18 (L) 06/21/2017 1027   GLUCOSE 225 (H) 06/21/2017 1027   GLUCOSE 27 (LL) 01/31/2014 2100   BUN 25 06/21/2017 1027   CREATININE 1.20 06/21/2017 1027   CALCIUM 8.3 (L) 06/21/2017 1027   PROT 6.8 02/21/2017 0836   ALBUMIN 3.5 (L) 06/21/2017 1027   AST 27 02/21/2017 0836   ALT 27 02/21/2017 0836   ALKPHOS 124 (H) 02/21/2017 0836   BILITOT 1.1 02/21/2017 0836   GFRNONAA 64 06/21/2017 1027   GFRAA 73 06/21/2017 1027     Diabetic Labs (most recent): Lab Results  Component Value Date   HGBA1C 7.3 (H) 06/21/2017   HGBA1C 8.4 (H) 02/21/2017   HGBA1C 11.9 11/09/2016   Lipid Panel     Component Value Date/Time   CHOL 82 (L) 02/21/2017 0836   TRIG 48 02/21/2017 0836   HDL 47 02/21/2017 0836   LDLCALC 25 02/21/2017 0836    Assessment & Plan:   1. Uncontrolled type 2 diabetes mellitus with other specified complication, with long-term current use of insulin ([place])  - Patient has currently uncontrolled symptomatic type 2 DM since  66 years of age. -  His A1c has improved to 7.3%, slowly improving from 11.9%. However, he came before his scheduled appointment due to loss of control of glycemia related to an abscess on left lower extremity. He has seen his PCP, currently on Bactrim 800-160 by mouth twice a day.   He has a history of alcoholic cirrhosis, his diabetes is complicated by peripheral arterial disease, sedentary life and patient remains at a high risk for more acute and chronic complications of diabetes which include CAD, CVA, CKD, retinopathy, and neuropathy. These are all discussed in detail with the patient.  - I have counseled the patient on diet management and weight loss, by adopting a carbohydrate restricted/protein rich diet.  -  Suggestion is made for him to avoid simple carbohydrates  from his diet including Cakes, Sweet Desserts / Pastries, Ice Cream, Soda (diet and regular), Sweet Tea, Candies, Chips, Cookies, Store Bought Juices, Alcohol in Excess of  1-2  drinks a day, Artificial Sweeteners, and "Sugar-free" Products. This will help patient to have stable blood glucose profile and potentially avoid unintended weight gain.  - I encouraged the patient to switch to  unprocessed or minimally processed complex starch and increased protein intake (animal or plant source), fruits, and vegetables.  - Patient is advised to stick to a routine mealtimes to eat 3 meals  a day and avoid unnecessary snacks ( to snack only to correct hypoglycemia).    - I have approached patient with the following individualized plan to manage diabetes and patient agrees:   - Due to his alcohol history with cirrhosis, he does not have suitable options for oral medications to treat diabetes.  - He will continue to require insulin in the basal/bolus schedule - His current hyperglycemia  is related to his lower extremity infection. I will proceed to increase his Levemir to 50 units daily at bedtime, increase NovoLog to 15 units 3 times a day before meals for pre-meal blood glucose readings of 90-150mg /dl, plus patient specific correction dose for unexpected hyperglycemia above 150mg /dl, associated with strict monitoring of glucose 4 times a day-before meals and at bedtime. - Patient is warned not to take insulin without proper monitoring per orders. -Adjustment parameters are given for hypo and hyperglycemia in writing. -Patient is encouraged to call clinic for blood glucose levels less than 70 or above 300 mg /dl.  -Patient is not a candidate for metformin, incretin therapy due to cirrhosis and history of alcoholism respectively.   - Patient specific target  A1c;  LDL, HDL, Triglycerides, and  Waist Circumference were discussed in detail.  2)  HTN:  Controlled. continue current medications including spironolactone 25 mg by mouth daily  3) Lipids: controlled with LDL of 25.  Given his history of alcoholic cirrhosis, he had advised to discontinue atorvastatin.   4)  Weight/Diet:  CDE Consult has been  initiated , exercise, and detailed carbohydrates information provided.  5) Chronic Care/Health Maintenance:  -Patient  Is on Statin medications and encouraged to continue to follow up with Ophthalmology, Podiatrist at least yearly or according to recommendations, and advised to  y away from smoking. I have recommended yearly flu vaccine and pneumonia vaccination at least every 5 years; moderate intensity exercise for up to 150 minutes weekly; and  sleep for at least 7 hours a day. - I advised him and his daughter to make another appointment with PCP to see if he needs I &D on his left lower extremity.  - I advised patient to maintain close follow up with Vidal Schwalbe, MD for primary care needs.  Follow up plan: - Return Keep Regular appoinment with labs.  Glade Lloyd, MD Phone: [phone]  Fax: [phone]  -  This note was partially dictated with voice recognition software. Similar sounding words can be transcribed inadequately or may not  be corrected upon review.  08/09/2017, 12:24 PM

## 2017-08-09 NOTE — Patient Instructions (Signed)

## 2017-08-21 ENCOUNTER — Other Ambulatory Visit: Payer: Self-pay | Admitting: "Endocrinology

## 2017-10-15 ENCOUNTER — Ambulatory Visit: Payer: Medicare Other | Admitting: "Endocrinology

## 2017-10-16 ENCOUNTER — Telehealth: Payer: Self-pay | Admitting: Nutrition

## 2017-10-16 NOTE — Telephone Encounter (Signed)
vm to call to discuss contacting [facility] about his Elenor Legato.

## 2017-10-31 ENCOUNTER — Other Ambulatory Visit: Payer: Self-pay | Admitting: "Endocrinology

## 2017-11-01 LAB — COMPREHENSIVE METABOLIC PANEL
A/G RATIO: 0.6 — AB (ref 1.2–2.2)
ALT: 21 IU/L (ref 0–44)
AST: 23 IU/L (ref 0–40)
Albumin: 2.8 g/dL — ABNORMAL LOW (ref 3.6–4.8)
Alkaline Phosphatase: 117 IU/L (ref 39–117)
BILIRUBIN TOTAL: 1.3 mg/dL — AB (ref 0.0–1.2)
BUN/Creatinine Ratio: 18 (ref 10–24)
BUN: 20 mg/dL (ref 8–27)
CHLORIDE: 109 mmol/L — AB (ref 96–106)
CO2: 18 mmol/L — ABNORMAL LOW (ref 20–29)
Calcium: 8.3 mg/dL — ABNORMAL LOW (ref 8.6–10.2)
Creatinine, Ser: 1.13 mg/dL (ref 0.76–1.27)
GFR calc non Af Amer: 68 mL/min/{1.73_m2} (ref >59)
GFR, EST AFRICAN AMERICAN: 79 mL/min/{1.73_m2} (ref >59)
GLUCOSE: 282 mg/dL — AB (ref 65–99)
Globulin, Total: 4.5 g/dL (ref 1.5–4.5)
POTASSIUM: 4.6 mmol/L (ref 3.5–5.2)
Sodium: 138 mmol/L (ref 134–144)
Total Protein: 7.3 g/dL (ref 6.0–8.5)

## 2017-11-01 LAB — HGB A1C W/O EAG: HEMOGLOBIN A1C: 8.1 % — AB (ref 4.8–5.6)

## 2017-11-09 ENCOUNTER — Encounter: Payer: Self-pay | Admitting: "Endocrinology

## 2017-11-09 ENCOUNTER — Ambulatory Visit (INDEPENDENT_AMBULATORY_CARE_PROVIDER_SITE_OTHER): Payer: Medicare Other | Admitting: "Endocrinology

## 2017-11-09 VITALS — BP 134/76 | HR 56 | Ht 68.0 in | Wt 205.0 lb

## 2017-11-09 DIAGNOSIS — E1165 Type 2 diabetes mellitus with hyperglycemia: Secondary | ICD-10-CM

## 2017-11-09 DIAGNOSIS — E782 Mixed hyperlipidemia: Secondary | ICD-10-CM | POA: Diagnosis not present

## 2017-11-09 DIAGNOSIS — I10 Essential (primary) hypertension: Secondary | ICD-10-CM | POA: Diagnosis not present

## 2017-11-09 NOTE — Patient Instructions (Signed)

## 2017-11-09 NOTE — Progress Notes (Signed)
Subjective:    Patient ID: Caleb Riley, male    DOB: June 08, 1951. Patient is being seen in f/u for management of diabetes requested by  Vidal Schwalbe, MD  Past Medical History:  Diagnosis Date  . Diabetes mellitus without complication ([place])   . Diarrhea, travelers'   . GERD (gastroesophageal reflux disease)   . Hypertension   . Staphylococcus aureus bacteremia    Past Surgical History:  Procedure Laterality Date  . i and d    . SKIN GRAFT     Social History   Socioeconomic History  . Marital status: Single    Spouse name: None  . Number of children: None  . Years of education: None  . Highest education level: None  Social Needs  . Financial resource strain: None  . Food insecurity - worry: None  . Food insecurity - inability: None  . Transportation needs - medical: None  . Transportation needs - non-medical: None  Occupational History  . None  Tobacco Use  . Smoking status: Never Smoker  . Smokeless tobacco: Never Used  Substance and Sexual Activity  . Alcohol use: No    Comment: former alcoholic  . Drug use: No  . Sexual activity: Not Currently  Other Topics Concern  . None  Social History Narrative   ** Merged History Encounter **       Outpatient Encounter Medications as of 11/09/2017  Medication Sig  . Continuous Blood Gluc Sensor (FREESTYLE LIBRE SENSOR SYSTEM) MISC Use one sensor every 10 days.  . insulin aspart (NOVOLOG FLEXPEN) 100 UNIT/ML FlexPen Inject 15-21 Units into the skin 3 (three) times daily with meals.  . lactulose (CHRONULAC) 10 GM/15ML solution Take 30 g by mouth 2 (two) times daily.  Marland Kitchen LEVEMIR FLEXTOUCH 100 UNIT/ML Pen INJECT 40 UNITS S.Q. ONCE DAILY AT 10 P.M.  . omeprazole (PRILOSEC) 20 MG capsule Take 20 mg by mouth daily.  . propranolol (INDERAL) 20 MG tablet Take 1 tablet (20 mg total) by mouth 2 (two) times daily.  . rifaximin (XIFAXAN) 550 MG TABS tablet Take 550 mg by mouth 2 (two) times daily.  Marland Kitchen spironolactone (ALDACTONE) 25  MG tablet Take 25 mg by mouth daily.  Marland Kitchen sulfamethoxazole-trimethoprim (BACTRIM DS,SEPTRA DS) 800-160 MG tablet Take 1 tablet 2 (two) times daily by mouth.  . tamsulosin (FLOMAX) 0.4 MG CAPS capsule Take 0.4 mg by mouth daily.  . Vitamin D, Ergocalciferol, (DRISDOL) 50000 units CAPS capsule Take 50,000 Units by mouth every 7 (seven) days.   No facility-administered encounter medications on file as of 11/09/2017.    ALLERGIES: No Known Allergies VACCINATION STATUS: Immunization History  Administered Date(s) Administered  . Influenza,inj,Quad PF,6+ Mos 10/14/2013    Diabetes  He presents for his follow-up diabetic visit. He has type 2 diabetes mellitus. Onset time: He was diagnosed at approximate age of 26 years. His disease course has been worsening. There are no hypoglycemic associated symptoms. Pertinent negatives for hypoglycemia include no confusion, headaches, pallor or seizures. Associated symptoms include blurred vision, polydipsia and polyuria. Pertinent negatives for diabetes include no chest pain, no fatigue, no polyphagia and no weakness. There are no hypoglycemic complications. Symptoms are worsening. Diabetic complications include peripheral neuropathy and PVD. Risk factors for coronary artery disease include diabetes mellitus, dyslipidemia, hypertension, male sex, sedentary lifestyle and tobacco exposure. Current diabetic treatment includes intensive insulin program. His weight is stable. He is following a generally unhealthy diet. When asked about meal planning, he reported none. He has not had  a previous visit with a dietitian. He never participates in exercise. His home blood glucose trend is increasing rapidly. His breakfast blood glucose range is generally >200 mg/dl. His lunch blood glucose range is generally >200 mg/dl. His dinner blood glucose range is generally >200 mg/dl. His bedtime blood glucose range is generally >200 mg/dl. His overall blood glucose range is >200 mg/dl.   Hypertension  This is a chronic problem. The current episode started more than 1 year ago. The problem is controlled. Associated symptoms include blurred vision. Pertinent negatives include no chest pain, headaches, neck pain, palpitations or shortness of breath. Risk factors for coronary artery disease include smoking/tobacco exposure, sedentary lifestyle, dyslipidemia, diabetes mellitus, obesity and male gender. Hypertensive end-organ damage includes PVD.  Hyperlipidemia  This is a chronic problem. The current episode started more than 1 year ago. Exacerbating diseases include diabetes and obesity. Associated symptoms include myalgias. Pertinent negatives include no chest pain or shortness of breath. Current antihyperlipidemic treatment includes statins. Risk factors for coronary artery disease include diabetes mellitus, dyslipidemia, hypertension, male sex, a sedentary lifestyle and obesity.    Review of Systems  Constitutional: Negative for chills, fatigue, fever and unexpected weight change.  HENT: Negative for dental problem, mouth sores and trouble swallowing.   Eyes: Positive for blurred vision. Negative for visual disturbance.  Respiratory: Negative for cough, choking, chest tightness, shortness of breath and wheezing.   Cardiovascular: Positive for leg swelling. Negative for chest pain and palpitations.  Gastrointestinal: Negative for abdominal distention, abdominal pain, constipation, diarrhea, nausea and vomiting.  Endocrine: Positive for polydipsia and polyuria. Negative for polyphagia.  Genitourinary: Negative for dysuria, flank pain, hematuria and urgency.  Musculoskeletal: Positive for gait problem and myalgias. Negative for back pain and neck pain.  Skin: Negative for pallor, rash and wound.  Neurological: Negative for seizures, syncope, weakness, numbness and headaches.  Psychiatric/Behavioral: Negative.  Negative for confusion and dysphoric mood.    Objective:    BP 134/76    Pulse (!) 56   Ht 5\' 8"  (1.727 m)   Wt 205 lb (93 kg)   BMI 31.17 kg/m   Wt Readings from Last 3 Encounters:  11/09/17 205 lb (93 kg)  08/09/17 206 lb (93.4 kg)  07/06/17 203 lb (92.1 kg)    Physical Exam  Constitutional: He is oriented to person, place, and time. He appears well-developed. He is cooperative. No distress.  There is significant language barrier.  He speaks only [place].  He usually is accompanied by his daughter, however today he came alone.  HENT:  Head: Normocephalic and atraumatic.  Eyes: EOM are normal.  Neck: Normal range of motion. No tracheal deviation present. No thyromegaly present.  Cardiovascular: Normal rate, S1 normal, S2 normal and normal heart sounds. Exam reveals no gallop.  No murmur heard. Pulses:      Dorsalis pedis pulses are 1+ on the right side, and 1+ on the left side.       Posterior tibial pulses are 1+ on the right side, and 1+ on the left side.  Pulmonary/Chest: Breath sounds normal. No respiratory distress. He has no wheezes.  Abdominal: Soft. Bowel sounds are normal. He exhibits no distension. There is no tenderness. There is no guarding and no CVA tenderness.  Musculoskeletal: He exhibits no edema.       Right shoulder: He exhibits no swelling and no deformity.  Neurological: He is alert and oriented to person, place, and time. He has normal strength and normal reflexes. No cranial nerve  deficit or sensory deficit. Gait normal.  Skin: Skin is warm. No rash noted. No cyanosis. Nails show no clubbing.  Psychiatric: He has a normal mood and affect. His speech is normal. Cognition and memory are normal.     CMP ( most recent) CMP     Component Value Date/Time   NA 138 10/31/2017 0830   K 4.6 10/31/2017 0830   CL 109 (H) 10/31/2017 0830   CO2 18 (L) 10/31/2017 0830   GLUCOSE 282 (H) 10/31/2017 0830   GLUCOSE 27 (LL) 01/31/2014 2100   BUN 20 10/31/2017 0830   CREATININE 1.13 10/31/2017 0830   CALCIUM 8.3 (L) 10/31/2017 0830    PROT 7.3 10/31/2017 0830   ALBUMIN 2.8 (L) 10/31/2017 0830   AST 23 10/31/2017 0830   ALT 21 10/31/2017 0830   ALKPHOS 117 10/31/2017 0830   BILITOT 1.3 (H) 10/31/2017 0830   GFRNONAA 68 10/31/2017 0830   GFRAA 79 10/31/2017 0830     Diabetic Labs (most recent): Lab Results  Component Value Date   HGBA1C 8.1 (H) 10/31/2017   HGBA1C 7.3 (H) 06/21/2017   HGBA1C 8.4 (H) 02/21/2017   Lipid Panel     Component Value Date/Time   CHOL 82 (L) 02/21/2017 0836   TRIG 48 02/21/2017 0836   HDL 47 02/21/2017 0836   LDLCALC 25 02/21/2017 0836    Assessment & Plan:   1. Uncontrolled type 2 diabetes mellitus with other specified complication, with long-term current use of insulin ([place])  - Patient has currently uncontrolled symptomatic type 2 DM since  67 years of age. -  His A1c has increased to 8.1% from 7.3%, although overall improving from 11.9%.    He has a history of alcoholic cirrhosis, his diabetes is complicated by peripheral arterial disease, sedentary life and patient remains at a high risk for more acute and chronic complications of diabetes which include CAD, CVA, CKD, retinopathy, and neuropathy. These are all discussed in detail with the patient.  - I have counseled the patient on diet management and weight loss, by adopting a carbohydrate restricted/protein rich diet.  -  Suggestion is made for him to avoid simple carbohydrates  from his diet including Cakes, Sweet Desserts / Pastries, Ice Cream, Soda (diet and regular), Sweet Tea, Candies, Chips, Cookies, Store Bought Juices, Alcohol in Excess of  1-2 drinks a day, Artificial Sweeteners, and "Sugar-free" Products. This will help patient to have stable blood glucose profile and potentially avoid unintended weight gain.   - I encouraged the patient to switch to  unprocessed or minimally processed complex starch and increased protein intake (animal or plant source), fruits, and vegetables.  - Patient is advised to stick to a  routine mealtimes to eat 3 meals  a day and avoid unnecessary snacks ( to snack only to correct hypoglycemia).    - I have approached patient with the following individualized plan to manage diabetes and patient agrees:   - Due to his alcohol history with cirrhosis, he does not have suitable options for oral medications to treat diabetes.  - He will continue to require insulin in the basal/bolus schedule. -#1 priority in his diabetes treatment is to avoid hypoglycemia.  -He speaks only Spanish, usually accompanied by his daughter, came alone today.   -I gave him written instructions to continue the same insulin dose and regimen with no change, to take home and discuss it with his daughter. -I would continue on Levemir 50 units nightly, NovoLog 15 units 3 times  a day before meals for pre-meal blood glucose readings of 90-150mg /dl, plus patient specific correction dose for unexpected hyperglycemia above 150mg /dl, associated with strict monitoring of glucose 4 times a day-before meals and at bedtime. - Patient is warned not to take insulin without proper monitoring per orders. -Adjustment parameters are given for hypo and hyperglycemia in writing. -Patient is encouraged to call clinic for blood glucose levels less than 70 or above 300 mg /dl.  -Patient is not a candidate for metformin, incretin therapy due to cirrhosis and history of alcoholism respectively.   - Patient specific target  A1c;  LDL, HDL, Triglycerides, and  Waist Circumference were discussed in detail.  2)  HTN: His blood pressure is controlled to target.  Continue current medications including spironolactone 25 mg by mouth daily  3) Lipids: controlled with LDL of 25.  Given his history of alcoholic cirrhosis, he had advised to discontinue atorvastatin.   4)  Weight/Diet: CDE Consult has been  initiated , exercise, and detailed carbohydrates information provided.  5) Chronic Care/Health Maintenance:  -Patient  Is on Statin  medications and encouraged to continue to follow up with Ophthalmology, Podiatrist at least yearly or according to recommendations, and advised to  y away from smoking. I have recommended yearly flu vaccine and pneumonia vaccination at least every 5 years;  and  sleep for at least 7 hours a day.  - I advised patient to maintain close follow up with Vidal Schwalbe, MD for primary care needs.  Follow up plan: - Return in about 3 months (around 02/06/2018) for follow up with pre-visit labs, meter, and logs.  Glade Lloyd, MD Phone: [phone]  Fax: [phone]  -  This note was partially dictated with voice recognition software. Similar sounding words can be transcribed inadequately or may not  be corrected upon review.  11/09/2017, 12:05 PM

## 2017-11-14 ENCOUNTER — Encounter (HOSPITAL_COMMUNITY): Payer: Self-pay | Admitting: *Deleted

## 2017-11-14 ENCOUNTER — Inpatient Hospital Stay (HOSPITAL_COMMUNITY)
Admission: EM | Admit: 2017-11-14 | Discharge: 2017-11-15 | DRG: 442 | Disposition: A | Discharge status: Home / Self Care | Payer: Medicare Other | Attending: Internal Medicine | Admitting: Internal Medicine

## 2017-11-14 ENCOUNTER — Emergency Department (HOSPITAL_COMMUNITY): Payer: Medicare Other

## 2017-11-14 ENCOUNTER — Other Ambulatory Visit: Payer: Self-pay

## 2017-11-14 DIAGNOSIS — Z23 Encounter for immunization: Secondary | ICD-10-CM | POA: Diagnosis not present

## 2017-11-14 DIAGNOSIS — K7290 Hepatic failure, unspecified without coma: Secondary | ICD-10-CM | POA: Diagnosis not present

## 2017-11-14 DIAGNOSIS — I10 Essential (primary) hypertension: Secondary | ICD-10-CM | POA: Diagnosis not present

## 2017-11-14 DIAGNOSIS — K219 Gastro-esophageal reflux disease without esophagitis: Secondary | ICD-10-CM | POA: Diagnosis present

## 2017-11-14 DIAGNOSIS — K7460 Unspecified cirrhosis of liver: Secondary | ICD-10-CM | POA: Diagnosis not present

## 2017-11-14 DIAGNOSIS — Z794 Long term (current) use of insulin: Secondary | ICD-10-CM

## 2017-11-14 DIAGNOSIS — K7200 Acute and subacute hepatic failure without coma: Principal | ICD-10-CM | POA: Diagnosis present

## 2017-11-14 DIAGNOSIS — D61818 Other pancytopenia: Secondary | ICD-10-CM | POA: Diagnosis present

## 2017-11-14 DIAGNOSIS — E1165 Type 2 diabetes mellitus with hyperglycemia: Secondary | ICD-10-CM | POA: Diagnosis not present

## 2017-11-14 DIAGNOSIS — R4182 Altered mental status, unspecified: Secondary | ICD-10-CM | POA: Diagnosis present

## 2017-11-14 DIAGNOSIS — K7682 Hepatic encephalopathy: Secondary | ICD-10-CM | POA: Diagnosis present

## 2017-11-14 LAB — VITAMIN B12: VITAMIN B 12: 523 pg/mL (ref 180–914)

## 2017-11-14 LAB — COMPREHENSIVE METABOLIC PANEL
ALT: 27 U/L (ref 17–63)
AST: 26 U/L (ref 15–41)
Albumin: 2.8 g/dL — ABNORMAL LOW (ref 3.5–5.0)
Alkaline Phosphatase: 104 U/L (ref 38–126)
Anion gap: 8 (ref 5–15)
BUN: 25 mg/dL — AB (ref 6–20)
CHLORIDE: 113 mmol/L — AB (ref 101–111)
CO2: 16 mmol/L — AB (ref 22–32)
CREATININE: 1.16 mg/dL (ref 0.61–1.24)
Calcium: 8.3 mg/dL — ABNORMAL LOW (ref 8.9–10.3)
Glucose, Bld: 237 mg/dL — ABNORMAL HIGH (ref 65–99)
Potassium: 4.5 mmol/L (ref 3.5–5.1)
SODIUM: 137 mmol/L (ref 135–145)
Total Bilirubin: 1.7 mg/dL — ABNORMAL HIGH (ref 0.3–1.2)
Total Protein: 7.1 g/dL (ref 6.5–8.1)

## 2017-11-14 LAB — BLOOD GAS, ARTERIAL
ACID-BASE DEFICIT: 7.4 mmol/L — AB (ref 0.0–2.0)
BICARBONATE: 18.7 mmol/L — AB (ref 20.0–28.0)
DRAWN BY: 22223
FIO2: 21
O2 SAT: 98.3 %
PO2 ART: 122 mmHg — AB (ref 83.0–108.0)
pCO2 arterial: 28.7 mmHg — ABNORMAL LOW (ref 32.0–48.0)
pH, Arterial: 7.382 (ref 7.350–7.450)

## 2017-11-14 LAB — TROPONIN I

## 2017-11-14 LAB — URINALYSIS, ROUTINE W REFLEX MICROSCOPIC
BILIRUBIN URINE: NEGATIVE
Glucose, UA: 150 mg/dL — AB
Hgb urine dipstick: NEGATIVE
KETONES UR: NEGATIVE mg/dL
LEUKOCYTES UA: NEGATIVE
NITRITE: NEGATIVE
Protein, ur: NEGATIVE mg/dL
Specific Gravity, Urine: 1.013 (ref 1.005–1.030)
pH: 5 (ref 5.0–8.0)

## 2017-11-14 LAB — CBC WITH DIFFERENTIAL/PLATELET
BASOS ABS: 0 10*3/uL (ref 0.0–0.1)
BASOS PCT: 0 %
EOS ABS: 0.1 10*3/uL (ref 0.0–0.7)
EOS PCT: 2 %
HCT: 26.2 % — ABNORMAL LOW (ref 39.0–52.0)
Hemoglobin: 9 g/dL — ABNORMAL LOW (ref 13.0–17.0)
Lymphocytes Relative: 40 %
Lymphs Abs: 3.1 10*3/uL (ref 0.7–4.0)
MCH: 30.9 pg (ref 26.0–34.0)
MCHC: 34.4 g/dL (ref 30.0–36.0)
MCV: 90 fL (ref 78.0–100.0)
Monocytes Absolute: 0.5 10*3/uL (ref 0.1–1.0)
Monocytes Relative: 6 %
Neutro Abs: 4 10*3/uL (ref 1.7–7.7)
Neutrophils Relative %: 52 %
PLATELETS: 37 10*3/uL — AB (ref 150–400)
RBC: 2.91 MIL/uL — AB (ref 4.22–5.81)
RDW: 14.4 % (ref 11.5–15.5)
WBC: 3.3 10*3/uL — AB (ref 4.0–10.5)

## 2017-11-14 LAB — CBG MONITORING, ED
GLUCOSE-CAPILLARY: 179 mg/dL — AB (ref 65–99)
GLUCOSE-CAPILLARY: 300 mg/dL — AB (ref 65–99)
Glucose-Capillary: 252 mg/dL — ABNORMAL HIGH (ref 65–99)

## 2017-11-14 LAB — AMMONIA: Ammonia: 143 umol/L — ABNORMAL HIGH (ref 9–35)

## 2017-11-14 LAB — I-STAT CG4 LACTIC ACID, ED
LACTIC ACID, VENOUS: 2.63 mmol/L — AB (ref 0.5–1.9)
LACTIC ACID, VENOUS: 1.79 mmol/L (ref 0.5–1.9)

## 2017-11-14 LAB — GLUCOSE, CAPILLARY
GLUCOSE-CAPILLARY: 290 mg/dL — AB (ref 65–99)
Glucose-Capillary: 354 mg/dL — ABNORMAL HIGH (ref 65–99)

## 2017-11-14 LAB — IRON AND TIBC
IRON: 99 ug/dL (ref 45–182)
Saturation Ratios: 36 % (ref 17.9–39.5)
TIBC: 272 ug/dL (ref 250–450)
UIBC: 173 ug/dL

## 2017-11-14 LAB — FERRITIN: Ferritin: 150 ng/mL (ref 24–336)

## 2017-11-14 LAB — FOLATE: FOLATE: 14.6 ng/mL (ref >5.9)

## 2017-11-14 LAB — LACTIC ACID, PLASMA: Lactic Acid, Venous: 1.1 mmol/L (ref 0.5–1.9)

## 2017-11-14 LAB — RETICULOCYTES
RBC.: 3.07 MIL/uL — AB (ref 4.22–5.81)
Retic Count, Absolute: 76.8 10*3/uL (ref 19.0–186.0)
Retic Ct Pct: 2.5 % (ref 0.4–3.1)

## 2017-11-14 MED ORDER — INSULIN ASPART 100 UNIT/ML ~~LOC~~ SOLN
0.0000 [IU] | Freq: Three times a day (TID) | SUBCUTANEOUS | Status: DC
  Administered 2017-11-14: 5 [IU] via SUBCUTANEOUS
  Filled 2017-11-14: qty 1

## 2017-11-14 MED ORDER — INSULIN DETEMIR 100 UNIT/ML ~~LOC~~ SOLN
20.0000 [IU] | Freq: Every day | SUBCUTANEOUS | Status: DC
  Administered 2017-11-14: 20 [IU] via SUBCUTANEOUS
  Filled 2017-11-14 (×2): qty 0.2

## 2017-11-14 MED ORDER — SODIUM CHLORIDE 0.45 % IV SOLN
INTRAVENOUS | Status: AC
  Administered 2017-11-14: 04:00:00 via INTRAVENOUS

## 2017-11-14 MED ORDER — INFLUENZA VAC SPLIT QUAD 0.5 ML IM SUSY
0.5000 mL | PREFILLED_SYRINGE | INTRAMUSCULAR | Status: AC
  Administered 2017-11-15: 0.5 mL via INTRAMUSCULAR
  Filled 2017-11-14: qty 0.5

## 2017-11-14 MED ORDER — RIFAXIMIN 550 MG PO TABS
550.0000 mg | ORAL_TABLET | Freq: Two times a day (BID) | ORAL | Status: DC
  Administered 2017-11-14 – 2017-11-15 (×3): 550 mg via ORAL
  Filled 2017-11-14 (×6): qty 1

## 2017-11-14 MED ORDER — LACTULOSE 10 GM/15ML PO SOLN
30.0000 g | Freq: Three times a day (TID) | ORAL | Status: DC
  Administered 2017-11-14 – 2017-11-15 (×5): 30 g via ORAL
  Filled 2017-11-14 (×5): qty 60

## 2017-11-14 MED ORDER — TAMSULOSIN HCL 0.4 MG PO CAPS
0.4000 mg | ORAL_CAPSULE | Freq: Every day | ORAL | Status: DC
  Administered 2017-11-14 – 2017-11-15 (×2): 0.4 mg via ORAL
  Filled 2017-11-14 (×2): qty 1

## 2017-11-14 MED ORDER — SPIRONOLACTONE 25 MG PO TABS
25.0000 mg | ORAL_TABLET | Freq: Every day | ORAL | Status: DC
  Administered 2017-11-14 – 2017-11-15 (×2): 25 mg via ORAL
  Filled 2017-11-14 (×4): qty 1

## 2017-11-14 MED ORDER — LACTULOSE 10 GM/15ML PO SOLN
30.0000 g | Freq: Once | ORAL | Status: AC
  Administered 2017-11-14: 30 g via ORAL
  Filled 2017-11-14: qty 60

## 2017-11-14 MED ORDER — FAMOTIDINE 20 MG PO TABS
20.0000 mg | ORAL_TABLET | Freq: Two times a day (BID) | ORAL | Status: DC
  Administered 2017-11-14 – 2017-11-15 (×3): 20 mg via ORAL
  Filled 2017-11-14 (×3): qty 1

## 2017-11-14 MED ORDER — INSULIN ASPART 100 UNIT/ML ~~LOC~~ SOLN
0.0000 [IU] | Freq: Every day | SUBCUTANEOUS | Status: DC
  Administered 2017-11-14: 5 [IU] via SUBCUTANEOUS

## 2017-11-14 MED ORDER — SODIUM BICARBONATE 650 MG PO TABS
650.0000 mg | ORAL_TABLET | Freq: Two times a day (BID) | ORAL | Status: DC
  Administered 2017-11-14 – 2017-11-15 (×3): 650 mg via ORAL
  Filled 2017-11-14 (×6): qty 1

## 2017-11-14 MED ORDER — PROPRANOLOL HCL 20 MG PO TABS
20.0000 mg | ORAL_TABLET | Freq: Two times a day (BID) | ORAL | Status: DC
  Administered 2017-11-14 – 2017-11-15 (×3): 20 mg via ORAL
  Filled 2017-11-14: qty 1
  Filled 2017-11-14: qty 2
  Filled 2017-11-14: qty 1

## 2017-11-14 MED ORDER — INSULIN ASPART 100 UNIT/ML ~~LOC~~ SOLN
0.0000 [IU] | Freq: Three times a day (TID) | SUBCUTANEOUS | Status: DC
  Administered 2017-11-14 – 2017-11-15 (×3): 8 [IU] via SUBCUTANEOUS
  Administered 2017-11-15: 3 [IU] via SUBCUTANEOUS
  Administered 2017-11-15: 8 [IU] via SUBCUTANEOUS
  Filled 2017-11-14: qty 1

## 2017-11-14 MED ORDER — ONDANSETRON HCL 4 MG PO TABS
4.0000 mg | ORAL_TABLET | Freq: Four times a day (QID) | ORAL | Status: DC | PRN
  Administered 2017-11-14: 4 mg via ORAL
  Filled 2017-11-14: qty 1

## 2017-11-14 MED ORDER — ONDANSETRON HCL 4 MG/2ML IJ SOLN: 4.0000 mg | Freq: Four times a day (QID) | INTRAMUSCULAR | Status: DC | PRN

## 2017-11-14 NOTE — ED Triage Notes (Signed)
Pt brought in by [facility] for c/o altered mental status; pt's cbg was 81 with ems; pt's meter has been reading in the 400's; pt is alert but not oriented

## 2017-11-14 NOTE — ED Notes (Signed)
Date and time results received: 11/14/17 0140 (use smartphrase ".now" to insert current time)  Test: istat lactic acid Critical Value: 2.63  Name of Provider Notified: dr Wyvonnia Dusky  Orders Received? Or Actions Taken?: Actions Taken: no orders received

## 2017-11-14 NOTE — H&P (Signed)
History and Physical    Arbie Blankley GEX:[id number] DOB: Sep 28, 1950 DOA: 11/14/2017  PCP: Vidal Schwalbe, MD   Patient coming from: Home.  I have personally briefly reviewed patient's old medical records in [place]  Chief Complaint: Altered mental status.   HPI: Declin Caleb Riley is a 67 y.o. male with medical history significant of type 2 diabetes, traveler's diarrhea, GERD, hypertension, history of staph aureus bacteremia who was brought in via EMS due to altered mental status and hyperglycemia.  There are no relatives at bedside to provide history at this time.  ED Course: Initial vital signs pulse 62, respirations 19, blood pressure 136/61 mmHg and O2 sat 100% on supplemental oxygen via nasal cannula.  His workup shows an urinalysis with glucosuria of 150 mg/dL, but no other finding.  A lactic acid of 2.63, follow-up at 1.79, then 1.1 mmol/L.  ABG showed a normal pH with decreased PCO2 of 28.7 and increase p.o. to 122.0 mmHg.  Bicarbonate was 18.7 millimol/L.  His CBC shows a white count of 3.3, hemoglobin of 9.0 and platelets of 37 with a normal differential.  Troponin was normal.  Sodium 137, potassium 4.5, chloride 113 and CO2 16 millimolar/L.  His anion gap was 8.  Glucose 237, BUN 25, creatinine 1.16 and calcium 8.3 millimolar/L.  His albumin was 2.8 g/dL and total bilirubin of 1.7 mg/dL.  All other LFTs were within normal limits.  Ammonia level was 143 mol/L.  Imaging: Chest radiograph showed cardiomegaly with mild central congestion, but no infiltrate CT of the head does not show any acute intracranial normality, but shows atrophy and small vessel ischemic changes of the white matter.  Please see images and full radiology report for further detail.  Review of Systems: Unable to obtain due to the patient's mental status..    Past Medical History:  Diagnosis Date  . Diabetes mellitus without complication ([place])   . Diarrhea, travelers'   . GERD (gastroesophageal reflux  disease)   . Hypertension   . Staphylococcus aureus bacteremia     Past Surgical History:  Procedure Laterality Date  . i and d    . SKIN GRAFT       reports that  has never smoked. he has never used smokeless tobacco. He reports that he does not drink alcohol or use drugs.  No Known Allergies  Family History  Problem Relation Age of Onset  . Hypertension Mother     Prior to Admission medications   Medication Sig Start Date End Date Taking? Authorizing Provider  Continuous Blood Gluc Sensor (FREESTYLE LIBRE SENSOR SYSTEM) MISC Use one sensor every 10 days. 07/06/17   Cassandria Anger, MD  insulin aspart (NOVOLOG FLEXPEN) 100 UNIT/ML FlexPen Inject 15-21 Units into the skin 3 (three) times daily with meals. 08/22/17   Cassandria Anger, MD  lactulose (CHRONULAC) 10 GM/15ML solution Take 30 g by mouth 2 (two) times daily.    [provider]  LEVEMIR FLEXTOUCH 100 UNIT/ML Pen INJECT 40 UNITS S.Q. ONCE DAILY AT 10 P.M. 07/20/17   Cassandria Anger, MD  omeprazole (PRILOSEC) 20 MG capsule Take 20 mg by mouth daily.    [provider]  propranolol (INDERAL) 20 MG tablet Take 1 tablet (20 mg total) by mouth 2 (two) times daily. 09/24/13   Samuella Cota, MD  rifaximin (XIFAXAN) 550 MG TABS tablet Take 550 mg by mouth 2 (two) times daily.    [provider]  spironolactone (ALDACTONE) 25 MG tablet Take 25  mg by mouth daily.    [provider]  tamsulosin (FLOMAX) 0.4 MG CAPS capsule Take 0.4 mg by mouth daily.    [provider]  Vitamin D, Ergocalciferol, (DRISDOL) 50000 units CAPS capsule Take 50,000 Units by mouth every 7 (seven) days.    [provider]    Physical Exam: Vitals:   11/14/17 0053 11/14/17 0300 11/14/17 0330 11/14/17 0400  BP:  136/61 (!) 150/71 (!) 144/67  Pulse:  62    Resp:  19 18 17   SpO2:  100%    Weight: 93 kg (205 lb)     Height: 5\' 8"  (1.727 m)       Constitutional: Somnolent, NAD,  calm, comfortable Eyes: PERRL, lids and conjunctivae normal ENMT: Mucous membranes are mildly dry. Posterior pharynx clear of any exudate or lesions. Neck: normal, supple, no masses, no thyromegaly Respiratory: Clear to auscultation bilaterally, no wheezing, no crackles. Normal respiratory effort. No accessory muscle use.  Cardiovascular: Regular rate and rhythm, no murmurs / rubs / gallops. No extremity edema. 2+ pedal pulses. No carotid bruits.  Abdomen: Mild ascites, soft, no tenderness, no masses palpated. No hepatosplenomegaly. Bowel sounds positive.  Musculoskeletal: no clubbing / cyanosis.Good ROM, no contractures. Normal muscle tone.  Skin: Hyperpigmentation of pretibial areas, particularly on left side.  There is a healing ulcer on left anterior ankle area.  Please see image below. Neurologic: CN 2-12 grossly intact.  Moves all extremities. Psychiatric: Oriented to person only.  Somnolent, follows simple commands.       Labs on Admission: I have personally reviewed following labs and imaging studies  CBC: Recent Labs  Lab 11/14/17 0128  WBC 3.3*  NEUTROABS 4.0  HGB 9.0*  HCT 26.2*  MCV 90.0  PLT 37*   Basic Metabolic Panel: Recent Labs  Lab 11/14/17 0128  NA 137  K 4.5  CL 113*  CO2 16*  GLUCOSE 237*  BUN 25*  CREATININE 1.16  CALCIUM 8.3*   GFR: Estimated Creatinine Clearance: 71.2 mL/min (by C-G formula based on SCr of 1.16 mg/dL). Liver Function Tests: Recent Labs  Lab 11/14/17 0128  AST 26  ALT 27  ALKPHOS 104  BILITOT 1.7*  PROT 7.1  ALBUMIN 2.8*   No results for input(s): LIPASE, AMYLASE in the last 168 hours. Recent Labs  Lab 11/14/17 0128  AMMONIA 143*   Coagulation Profile: No results for input(s): INR, PROTIME in the last 168 hours. Cardiac Enzymes: Recent Labs  Lab 11/14/17 0128  TROPONINI <0.03   BNP (last 3 results) No results for input(s): PROBNP in the last 8760 hours. HbA1C: No results for input(s): HGBA1C in the last  72 hours. CBG: Recent Labs  Lab 11/14/17 0052  GLUCAP 179*   Lipid Profile: No results for input(s): CHOL, HDL, LDLCALC, TRIG, CHOLHDL, LDLDIRECT in the last 72 hours. Thyroid Function Tests: No results for input(s): TSH, T4TOTAL, FREET4, T3FREE, THYROIDAB in the last 72 hours. Anemia Panel: No results for input(s): VITAMINB12, FOLATE, FERRITIN, TIBC, IRON, RETICCTPCT in the last 72 hours. Urine analysis:    Component Value Date/Time   COLORURINE YELLOW 11/14/2017 0300   APPEARANCEUR CLEAR 11/14/2017 0300   LABSPEC 1.013 11/14/2017 0300   PHURINE 5.0 11/14/2017 0300   GLUCOSEU 150 (A) 11/14/2017 0300   HGBUR NEGATIVE 11/14/2017 0300   BILIRUBINUR NEGATIVE 11/14/2017 0300   KETONESUR NEGATIVE 11/14/2017 0300   PROTEINUR NEGATIVE 11/14/2017 0300   UROBILINOGEN 0.2 01/31/2014 2222   NITRITE NEGATIVE 11/14/2017 0300   LEUKOCYTESUR NEGATIVE 11/14/2017  0300    Radiological Exams on Admission: Dg Chest 2 View  Result Date: 11/14/2017 CLINICAL DATA:  Altered mental status EXAM: CHEST  2 VIEW COMPARISON:  09/24/2013 FINDINGS: Cardiomegaly with mild central congestion. No focal pulmonary opacity or pleural effusion. Negative for a pneumothorax. IMPRESSION: Cardiomegaly with mild central congestion. No focal pulmonary infiltrate. Electronically Signed   By: Donavan Foil M.D.   On: 11/14/2017 02:37   Ct Head Wo Contrast  Result Date: 11/14/2017 CLINICAL DATA:  Altered mental status EXAM: CT HEAD WITHOUT CONTRAST TECHNIQUE: Contiguous axial images were obtained from the base of the skull through the vertex without intravenous contrast. COMPARISON:  09/19/2013 FINDINGS: Brain: No acute territorial infarction, hemorrhage or intracranial mass is visualized. Mild atrophy. Mild small vessel ischemic changes of the white matter. Stable ventricle size. Vascular: No hyperdense vessels. Scattered calcifications at the carotid siphons Skull: Normal. Negative for fracture or focal lesion.  Sinuses/Orbits: Mucosal thickening in the sphenoid and ethmoid and maxillary sinuses. No acute orbital abnormality. Old appearing deformities of the medial walls of the orbits. Other: None IMPRESSION: 1. No CT evidence for acute intracranial abnormality. 2. Atrophy and small vessel ischemic changes of the white matter Electronically Signed   By: Donavan Foil M.D.   On: 11/14/2017 02:37    EKG: Independently reviewed.  Vent. rate 58 BPM PR interval * ms QRS duration 103 ms QT/QTc 479/471 ms P-R-T axes 40 14 31 Sinus rhythm Low voltage, precordial leads  Assessment/Plan Principal Problem:   Hepatic encephalopathy (HCC) Admit to inpatient/Telemetry. Continue supplemental oxygen. Neuro checks every 4 hours. Continue lactulose 30 g p.o. 3 times daily. Follow up ammonia level.  Active Problems:     Cirrhosis of liver (HCC) Monitor LFTs and ammonia level.    Type 2 diabetes mellitus, uncontrolled (HCC) Carbohydrate modified diet. Last hemoglobin A1c 8.1% on 10/31/2017 CBG monitoring with regular insulin sliding scale.    Essential hypertension, benign Will need updated medications list, before ordering home therapy, Monitor blood pressure.    Pancytopenia (HCC) Monitor daily CBC.    GERD (gastroesophageal reflux disease) Famotidine 20 mg p.o. twice daily.    DVT prophylaxis: SCDs. Code Status: Full code. Family Communication: None at bedside. Disposition Plan: Admit for close monitoring, lactulose and blood glucose control. Consults called:  Admission status: Inpatient/telemetry.   Reubin Milan MD [facility] Pager [phone].  If 7PM-7AM, please contact night-coverage [email or web address] Password [facility]  11/14/2017, 6:53 AM

## 2017-11-14 NOTE — Progress Notes (Signed)
I have seen and assessed patient and I agree with Dr. Olevia Bowens assessment and plan.  Patient is a 67 year old Hispanic gentleman history of type 2 diabetes, GERD, hypertension, history of staph aureus bacteremia brought in by EMS secondary to altered mental status and hypoglycemia.  Patient placed on lactulose.  Ammonia level noted to be elevated at 143.  Patient with good bowel movement this morning.  Patient improving clinically.  Place back on home regimen of Xifaxan, Aldactone, Flomax.  Continue current dose of lactulose.  Place on Levemir at half home dose of 20 units daily at 10 PM.  Change sliding scale insulin to moderate scale.  Follow.  No charge.

## 2017-11-14 NOTE — ED Notes (Signed)
PT ambulated to restroom at this and had large bowel movement. PT alert and oriented.

## 2017-11-14 NOTE — ED Provider Notes (Signed)
[facility] EMERGENCY DEPARTMENT Provider Note   CSN: [id number] Arrival date & time: 11/14/17  0048     History   Chief Complaint Chief Complaint  Patient presents with  . Altered Mental Status    HPI Jemuel Laursen is a 67 y.o. male.  Level 5 caveat for altered mental status.  Unclear last seen normal.  Patient apparently had a CBG for 81 with EMS and a blood sugar of in 400s at home.  Blood sugar then dropped into the 40s and patient was given p.o. glucose.  Patient has a history of diabetes as well as cirrhosis.  He is slow to respond.  He denies any pain.  No nausea or vomiting.  No evidence of trauma.  No caregivers at bedside.   The history is provided by the patient and the EMS personnel. The history is limited by the condition of the patient.  Altered Mental Status      Past Medical History:  Diagnosis Date  . Diabetes mellitus without complication ([place])   . Diarrhea, travelers'   . GERD (gastroesophageal reflux disease)   . Hypertension   . Staphylococcus aureus bacteremia     Patient Active Problem List   Diagnosis Date Noted  . Uncontrolled type 2 diabetes mellitus with hyperglycemia ([place]) 08/09/2017  . Mixed hyperlipidemia 07/06/2017  . Obesity 09/21/2013  . Staphylococcus aureus bacteremia 09/20/2013  . Cirrhosis of liver ([place]) 09/20/2013  . Sepsis ([place]) 09/19/2013  . UTI (lower urinary tract infection) 09/19/2013  . Lethargy 09/19/2013  . Type 2 diabetes mellitus, uncontrolled ([place]) 09/19/2013  . Essential hypertension, benign 09/19/2013  . Encephalopathy, hepatic ([place]) 09/19/2013  . Pancytopenia ([place]) 09/19/2013    Past Surgical History:  Procedure Laterality Date  . i and d    . SKIN GRAFT         Home Medications    Prior to Admission medications   Medication Sig Start Date End Date Taking? Authorizing Provider  Continuous Blood Gluc Sensor (FREESTYLE LIBRE SENSOR SYSTEM) MISC Use one sensor every 10 days. 07/06/17   Cassandria Anger, MD  insulin aspart (NOVOLOG FLEXPEN) 100 UNIT/ML FlexPen Inject 15-21 Units into the skin 3 (three) times daily with meals. 08/22/17   Cassandria Anger, MD  lactulose (CHRONULAC) 10 GM/15ML solution Take 30 g by mouth 2 (two) times daily.    [provider]  LEVEMIR FLEXTOUCH 100 UNIT/ML Pen INJECT 40 UNITS S.Q. ONCE DAILY AT 10 P.M. 07/20/17   Cassandria Anger, MD  omeprazole (PRILOSEC) 20 MG capsule Take 20 mg by mouth daily.    [provider]  propranolol (INDERAL) 20 MG tablet Take 1 tablet (20 mg total) by mouth 2 (two) times daily. 09/24/13   Samuella Cota, MD  rifaximin (XIFAXAN) 550 MG TABS tablet Take 550 mg by mouth 2 (two) times daily.    [provider]  spironolactone (ALDACTONE) 25 MG tablet Take 25 mg by mouth daily.    [provider]  tamsulosin (FLOMAX) 0.4 MG CAPS capsule Take 0.4 mg by mouth daily.    [provider]  Vitamin D, Ergocalciferol, (DRISDOL) 50000 units CAPS capsule Take 50,000 Units by mouth every 7 (seven) days.    [provider]    Family History Family History  Problem Relation Age of Onset  . Hypertension Mother     Social History Social History   Tobacco Use  . Smoking status: Never Smoker  . Smokeless tobacco: Never Used  Substance Use  Topics  . Alcohol use: No    Comment: former alcoholic  . Drug use: No     Allergies   Patient has no known allergies.   Review of Systems Review of Systems  Unable to perform ROS: Mental status change     Physical Exam Updated Vital Signs BP (!) 144/67   Pulse 62   Resp 17   Ht 5\' 8"  (1.727 m)   Wt 93 kg (205 lb)   SpO2 100%   BMI 31.17 kg/m   Physical Exam  Constitutional: He appears well-developed and well-nourished. No distress.  Patient is somnolent, arouses to pain, follows some commands, answer some questions He apparently speaks limited [place].  HENT:  Head: Normocephalic and atraumatic.  Mouth/Throat:  Oropharynx is clear and moist. No oropharyngeal exudate.  Eyes: Conjunctivae and EOM are normal. Pupils are equal, round, and reactive to light.  Neck: Normal range of motion. Neck supple.  No meningismus.  Cardiovascular: Normal rate, regular rhythm, normal heart sounds and intact distal pulses.  No murmur heard. Pulmonary/Chest: Effort normal and breath sounds normal. No respiratory distress. He exhibits no tenderness.  Abdominal: Soft. There is no tenderness. There is no rebound and no guarding.  Musculoskeletal: Normal range of motion. He exhibits no edema or tenderness.  Neurological: He is alert. No cranial nerve deficit. He exhibits normal muscle tone. Coordination normal.  Patient is alert to person only.  Moves all extremities, follows some commands, cranial nerves II to XII intact  Skin: Skin is warm.  Psychiatric: He has a normal mood and affect. His behavior is normal.  Nursing note and vitals reviewed.    ED Treatments / Results  Labs (all labs ordered are listed, but only abnormal results are displayed) Labs Reviewed  CBC WITH DIFFERENTIAL/PLATELET - Abnormal; Notable for the following components:      Result Value   WBC 3.3 (*)    RBC 2.91 (*)    Hemoglobin 9.0 (*)    HCT 26.2 (*)    Platelets 37 (*)    All other components within normal limits  COMPREHENSIVE METABOLIC PANEL - Abnormal; Notable for the following components:   Chloride 113 (*)    CO2 16 (*)    Glucose, Bld 237 (*)    BUN 25 (*)    Calcium 8.3 (*)    Albumin 2.8 (*)    Total Bilirubin 1.7 (*)    All other components within normal limits  AMMONIA - Abnormal; Notable for the following components:   Ammonia 143 (*)    All other components within normal limits  URINALYSIS, ROUTINE W REFLEX MICROSCOPIC - Abnormal; Notable for the following components:   Glucose, UA 150 (*)    All other components within normal limits  BLOOD GAS, ARTERIAL - Abnormal; Notable for the following components:   pCO2  arterial 28.7 (*)    pO2, Arterial 122.0 (*)    Bicarbonate 18.7 (*)    Acid-base deficit 7.4 (*)    All other components within normal limits  CBG MONITORING, ED - Abnormal; Notable for the following components:   Glucose-Capillary 179 (*)    All other components within normal limits  I-STAT CG4 LACTIC ACID, ED - Abnormal; Notable for the following components:   Lactic Acid, Venous 2.63 (*)    All other components within normal limits  TROPONIN I  LACTIC ACID, PLASMA  LACTIC ACID, PLASMA  I-STAT CG4 LACTIC ACID, ED    EKG  EKG Interpretation  Date/Time:  Wednesday November 14 2017 01:07:15 EST Ventricular Rate:  58 PR Interval:    QRS Duration: 103 QT Interval:  479 QTC Calculation: 471 R Axis:   14 Text Interpretation:  Sinus rhythm Low voltage, precordial leads No significant change was found Confirmed by Ezequiel Essex [id number]) on 11/14/2017 1:50:27 AM       Radiology Dg Chest 2 View  Result Date: 11/14/2017 CLINICAL DATA:  Altered mental status EXAM: CHEST  2 VIEW COMPARISON:  09/24/2013 FINDINGS: Cardiomegaly with mild central congestion. No focal pulmonary opacity or pleural effusion. Negative for a pneumothorax. IMPRESSION: Cardiomegaly with mild central congestion. No focal pulmonary infiltrate. Electronically Signed   By: Donavan Foil M.D.   On: 11/14/2017 02:37   Ct Head Wo Contrast  Result Date: 11/14/2017 CLINICAL DATA:  Altered mental status EXAM: CT HEAD WITHOUT CONTRAST TECHNIQUE: Contiguous axial images were obtained from the base of the skull through the vertex without intravenous contrast. COMPARISON:  09/19/2013 FINDINGS: Brain: No acute territorial infarction, hemorrhage or intracranial mass is visualized. Mild atrophy. Mild small vessel ischemic changes of the white matter. Stable ventricle size. Vascular: No hyperdense vessels. Scattered calcifications at the carotid siphons Skull: Normal. Negative for fracture or focal lesion. Sinuses/Orbits: Mucosal  thickening in the sphenoid and ethmoid and maxillary sinuses. No acute orbital abnormality. Old appearing deformities of the medial walls of the orbits. Other: None IMPRESSION: 1. No CT evidence for acute intracranial abnormality. 2. Atrophy and small vessel ischemic changes of the white matter Electronically Signed   By: Donavan Foil M.D.   On: 11/14/2017 02:37    Procedures Procedures (including critical care time)  Medications Ordered in ED Medications - No data to display   Initial Impression / Assessment and Plan / ED Course  I have reviewed the triage vital signs and the nursing notes.  Pertinent labs & imaging results that were available during my care of the patient were reviewed by me and considered in my medical decision making (see chart for details).    Patient presents with altered mental status.  Blood sugar is normal on arrival.  Unclear time last seen normal.  No apparent focal neurological deficits  Patient with encephalopathy without focal neurological deficit.  Unclear last seen normal.  Family not available.  Mental status workup pursued.  CT head is negative.  Patient does have history of cirrhosis and will consider hepatic encephalopathy.  He is afebrile without evidence of infection in urine or chest x-ray.  Ammonia is 143.  No evidence of CO2 retention on ABG.  We will treat his hepatic encephalopathy.  Blood sugar has stabilized.  Lactate mildly elevated without evidence of infection.  Patient will be hydrated gently.  Patient is able to tolerate p.o. lactulose and is protecting his airway.  Admission discussed with Dr. Olevia Bowens  CRITICAL CARE Performed by: Ezequiel Essex Total critical care time: 40 minutes Critical care time was exclusive of separately billable procedures and treating other patients. Critical care was necessary to treat or prevent imminent or life-threatening deterioration. Critical care was time spent personally by me on the following  activities: development of treatment plan with patient and/or surrogate as well as nursing, discussions with consultants, evaluation of patient's response to treatment, examination of patient, obtaining history from patient or surrogate, ordering and performing treatments and interventions, ordering and review of laboratory studies, ordering and review of radiographic studies, pulse oximetry and re-evaluation of patient's condition.   Final Clinical Impressions(s) / ED Diagnoses   Final diagnoses:  Hepatic encephalopathy [facility])    ED Discharge Orders    None       Topher Buenaventura, Annie Main, MD 11/14/17 [phone]

## 2017-11-15 DIAGNOSIS — Z23 Encounter for immunization: Secondary | ICD-10-CM | POA: Diagnosis not present

## 2017-11-15 DIAGNOSIS — I10 Essential (primary) hypertension: Secondary | ICD-10-CM | POA: Diagnosis not present

## 2017-11-15 DIAGNOSIS — D61818 Other pancytopenia: Secondary | ICD-10-CM

## 2017-11-15 DIAGNOSIS — K7290 Hepatic failure, unspecified without coma: Secondary | ICD-10-CM | POA: Diagnosis not present

## 2017-11-15 DIAGNOSIS — K7200 Acute and subacute hepatic failure without coma: Secondary | ICD-10-CM | POA: Diagnosis not present

## 2017-11-15 DIAGNOSIS — K7460 Unspecified cirrhosis of liver: Secondary | ICD-10-CM | POA: Diagnosis not present

## 2017-11-15 DIAGNOSIS — R4182 Altered mental status, unspecified: Secondary | ICD-10-CM | POA: Diagnosis present

## 2017-11-15 DIAGNOSIS — K219 Gastro-esophageal reflux disease without esophagitis: Secondary | ICD-10-CM | POA: Diagnosis not present

## 2017-11-15 DIAGNOSIS — E1165 Type 2 diabetes mellitus with hyperglycemia: Secondary | ICD-10-CM | POA: Diagnosis not present

## 2017-11-15 LAB — CBC
HCT: 24.2 % — ABNORMAL LOW (ref 39.0–52.0)
Hemoglobin: 8.2 g/dL — ABNORMAL LOW (ref 13.0–17.0)
MCH: 30.6 pg (ref 26.0–34.0)
MCHC: 33.9 g/dL (ref 30.0–36.0)
MCV: 90.3 fL (ref 78.0–100.0)
PLATELETS: 32 10*3/uL — AB (ref 150–400)
RBC: 2.68 MIL/uL — AB (ref 4.22–5.81)
RDW: 15 % (ref 11.5–15.5)
WBC: 1.7 10*3/uL — ABNORMAL LOW (ref 4.0–10.5)

## 2017-11-15 LAB — COMPREHENSIVE METABOLIC PANEL
ALBUMIN: 2.4 g/dL — AB (ref 3.5–5.0)
ALK PHOS: 79 U/L (ref 38–126)
ALT: 23 U/L (ref 17–63)
AST: 19 U/L (ref 15–41)
Anion gap: 5 (ref 5–15)
BUN: 24 mg/dL — AB (ref 6–20)
CALCIUM: 8 mg/dL — AB (ref 8.9–10.3)
CHLORIDE: 113 mmol/L — AB (ref 101–111)
CO2: 18 mmol/L — AB (ref 22–32)
CREATININE: 1.12 mg/dL (ref 0.61–1.24)
GFR calc Af Amer: >60 mL/min (ref >60)
GFR calc non Af Amer: >60 mL/min (ref >60)
GLUCOSE: 227 mg/dL — AB (ref 65–99)
Potassium: 4.2 mmol/L (ref 3.5–5.1)
SODIUM: 136 mmol/L (ref 135–145)
Total Bilirubin: 1 mg/dL (ref 0.3–1.2)
Total Protein: 6 g/dL — ABNORMAL LOW (ref 6.5–8.1)

## 2017-11-15 LAB — GLUCOSE, CAPILLARY
Glucose-Capillary: 179 mg/dL — ABNORMAL HIGH (ref 65–99)
Glucose-Capillary: 261 mg/dL — ABNORMAL HIGH (ref 65–99)
Glucose-Capillary: 287 mg/dL — ABNORMAL HIGH (ref 65–99)

## 2017-11-15 LAB — PROTIME-INR
INR: 1.41
Prothrombin Time: 17.1 seconds — ABNORMAL HIGH (ref 11.4–15.2)

## 2017-11-15 LAB — AMMONIA: Ammonia: 58 umol/L — ABNORMAL HIGH (ref 9–35)

## 2017-11-15 MED ORDER — INSULIN ASPART 100 UNIT/ML ~~LOC~~ SOLN
4.0000 [IU] | Freq: Three times a day (TID) | SUBCUTANEOUS | Status: DC
  Administered 2017-11-15 (×2): 4 [IU] via SUBCUTANEOUS

## 2017-11-15 MED ORDER — LACTULOSE 10 GM/15ML PO SOLN: 30.0000 g | Freq: Three times a day (TID) | ORAL | 0 refills | Status: DC

## 2017-11-15 NOTE — Progress Notes (Signed)
Patient discharged home.  IV removed - WNL.  Reviewed AVS  - verbalizes understanding.  Instructed to follow up with PCP and GI.  No questions at this time.  Patient in NAD

## 2017-11-15 NOTE — Progress Notes (Signed)
Inpatient Diabetes Program Recommendations  AACE/ADA: New Consensus Statement on Inpatient Glycemic Control (2015)  Target Ranges:  Prepandial:   less than 140 mg/dL      Peak postprandial:   less than 180 mg/dL (1-2 hours)      Critically ill patients:  140 - 180 mg/dL   Results for TOSHIO, SLUSHER (MRN [id number]) as of 11/15/2017 09:00  Ref. Range 11/14/2017 08:07 11/14/2017 13:02 11/14/2017 17:31 11/14/2017 22:00 11/15/2017 07:40  Glucose-Capillary Latest Ref Range: 65 - 99 mg/dL 252 (H) 300 (H) 290 (H) 354 (H) 179 (H)   Review of Glycemic Control  Diabetes history: DM2 Outpatient Diabetes medications: Levemir 40 units QHS, Novolog 15-21 units TID Current orders for Inpatient glycemic control: Levemir 20 units QHS, Novolog 0-15 units TID with meals, Novolog 0-5 units QHS  Inpatient Diabetes Program Recommendations:  Insulin - Meal Coverage: Please consider ordering Novolog 4 units TID with meals for meal coverage if patient eats at least 50% of meals.  Thanks, Barnie Alderman, RN, MSN, CDE Diabetes Coordinator Inpatient Diabetes Program [phone] (Team Pager from 8am to 5pm)

## 2017-11-15 NOTE — Discharge Summary (Signed)
Physician Discharge Summary  Caleb Riley LEX:[id number] DOB: 11/05/1950 DOA: 11/14/2017  PCP: Vidal Schwalbe, MD  Admit date: 11/14/2017 Discharge date: 11/15/2017  Time spent: 60 minutes  Recommendations for Outpatient Follow-up:  1. Follow-up with Vidal Schwalbe, MD in 1 week.  On follow-up patient need a CBC done to follow-up on white count, hemoglobin and platelets.  Patient will also need a comprehensive metabolic profile done to follow-up on electrolytes and renal function and LFTs. 2. Follow-up with a gastroenterologist in the next 2-3 weeks.   Discharge Diagnoses:  Principal Problem:   Hepatic encephalopathy ([place]) Active Problems:   Type 2 diabetes mellitus, uncontrolled ([place])   Essential hypertension, benign   Pancytopenia ([place])   Cirrhosis of liver ([facility])   GERD (gastroesophageal reflux disease)   Discharge Condition: Stable and improved  Diet recommendation: Heart healthy  Filed Weights   11/14/17 0053  Weight: 93 kg (205 lb)    History of present illness:  Per Dr.Ortiz Bridget Westbrooks is a 67 y.o. male with medical history significant of type 2 diabetes, traveler's diarrhea, GERD, hypertension, history of staph aureus bacteremia who was brought in via EMS due to altered mental status and hyperglycemia.  There are no relatives at bedside to provide history at this time.  ED Course: Initial vital signs pulse 62, respirations 19, blood pressure 136/61 mmHg and O2 sat 100% on supplemental oxygen via nasal cannula.  His workup showed an urinalysis with glucosuria of 150 mg/dL, but no other finding.  A lactic acid of 2.63, follow-up at 1.79, then 1.1 mmol/L.  ABG showed a normal pH with decreased PCO2 of 28.7 and increase p.o. to 122.0 mmHg.  Bicarbonate was 18.7 millimol/L.  His CBC shows a white count of 3.3, hemoglobin of 9.0 and platelets of 37 with a normal differential.  Troponin was normal.  Sodium 137, potassium 4.5, chloride 113 and CO2 16 millimolar/L.  His anion  gap was 8.  Glucose 237, BUN 25, creatinine 1.16 and calcium 8.3 millimolar/L.  His albumin was 2.8 g/dL and total bilirubin of 1.7 mg/dL.  All other LFTs were within normal limits.  Ammonia level was 143 mol/L.  Imaging: Chest radiograph showed cardiomegaly with mild central congestion, but no infiltrate CT of the head does not show any acute intracranial normality, but shows atrophy and small vessel ischemic changes of the white matter.  Please see images and full radiology report for further detail.  Hospital Course:  #1 acute hepatic encephalopathy Patient was admitted with confusion and noted to have a elevated ammonia level as high as 143.  Patient was admitted to telemetry and placed on increased dose of lactulose 30 g 3 times daily.  Patient Doreene Nest was resumed.  Patient was monitored.  Patient improved clinically and ammonia levels have trended down such that by day of discharge ammonia level was down to 58.  Patient improved clinically was alert to self place and time and close to baseline by day of discharge.  Patient be discharged in stable and improved condition and is to follow-up with PCP and gastroenterologist in the outpatient setting.  2.  Cirrhosis of the liver Remained compensated during the hospitalization.  Patient was maintained on his home regimen of Xifaxan, lactulose, spironolactone, Lasix, Inderal.  Outpatient follow-up with gastroenterology.  3.  Diabetes mellitus Remained stable throughout the hospitalization.  Patient was maintained on Levemir as well as meal coverage insulin and sliding scale insulin.  Outpatient follow-up.  4.  Gastroesophageal reflux disease Patient maintained on a PPI  as well as Pepcid.  Patient will be discharged home on his home regimen of PPI.  Outpatient follow-up with PCP.  5.  Pancytopenia Likely secondary to history of cirrhosis.  Outpatient follow-up.  6.  Hypertension Remained stable throughout the hospitalization.  Patient maintained  on home regimen of spironolactone, Lasix, Inderal.  Outpatient follow-up with PCP.  Procedures:  CT head 11/14/2017  Chest x-ray 11/14/2017  Consultations:  None  Discharge Exam: Vitals:   11/15/17 0518 11/15/17 1300  BP: (!) 143/62 (!) 135/55  Pulse: 65 62  Resp:  (!) 24  Temp: 98.5 F (36.9 C) 98.6 F (37 C)  SpO2: 100% 100%    General: NAD Cardiovascular: RRR Respiratory: CTAB  Discharge Instructions   Discharge Instructions    Diet - low sodium heart healthy   Complete by:  As directed    Increase activity slowly   Complete by:  As directed      Allergies as of 11/15/2017   No Known Allergies     Medication List    TAKE these medications   atorvastatin 10 MG tablet Commonly known as:  LIPITOR Take 1 tablet by mouth daily.   FREESTYLE LIBRE SENSOR SYSTEM Misc Use one sensor every 10 days.   insulin aspart 100 UNIT/ML FlexPen Commonly known as:  NOVOLOG FLEXPEN Inject 15-21 Units into the skin 3 (three) times daily with meals.   lactulose 10 GM/15ML solution Commonly known as:  CHRONULAC Take 45 mLs (30 g total) by mouth 3 (three) times daily. What changed:  when to take this   LEVEMIR FLEXTOUCH 100 UNIT/ML Pen Generic drug:  Insulin Detemir INJECT 40 UNITS S.Q. ONCE DAILY AT 10 P.M.   omeprazole 20 MG capsule Commonly known as:  PRILOSEC Take 20 mg by mouth daily.   propranolol 20 MG tablet Commonly known as:  INDERAL Take 1 tablet (20 mg total) by mouth 2 (two) times daily.   rifaximin 550 MG Tabs tablet Commonly known as:  XIFAXAN Take 550 mg by mouth 2 (two) times daily.   spironolactone 25 MG tablet Commonly known as:  ALDACTONE Take 25 mg by mouth daily.   tamsulosin 0.4 MG Caps capsule Commonly known as:  FLOMAX Take 0.4 mg by mouth daily.   Vitamin D (Ergocalciferol) 50000 units Caps capsule Commonly known as:  DRISDOL Take 50,000 Units by mouth every 7 (seven) days.      No Known Allergies Follow-up Information     Vidal Schwalbe, MD. Schedule an appointment as soon as possible for a visit in 1 week(s).   Specialty:  Family Medicine Contact information: [address] [place] [address] [phone]        GASTROENTEROLOGIST. Schedule an appointment as soon as possible for a visit in 2 week(s).   Why:  Follow-up in 2-3 weeks           The results of significant diagnostics from this hospitalization (including imaging, microbiology, ancillary and laboratory) are listed below for reference.    Significant Diagnostic Studies: Dg Chest 2 View  Result Date: 11/14/2017 CLINICAL DATA:  Altered mental status EXAM: CHEST  2 VIEW COMPARISON:  09/24/2013 FINDINGS: Cardiomegaly with mild central congestion. No focal pulmonary opacity or pleural effusion. Negative for a pneumothorax. IMPRESSION: Cardiomegaly with mild central congestion. No focal pulmonary infiltrate. Electronically Signed   By: Donavan Foil M.D.   On: 11/14/2017 02:37   Ct Head Wo Contrast  Result Date: 11/14/2017 CLINICAL DATA:  Altered mental status EXAM: CT  HEAD WITHOUT CONTRAST TECHNIQUE: Contiguous axial images were obtained from the base of the skull through the vertex without intravenous contrast. COMPARISON:  09/19/2013 FINDINGS: Brain: No acute territorial infarction, hemorrhage or intracranial mass is visualized. Mild atrophy. Mild small vessel ischemic changes of the white matter. Stable ventricle size. Vascular: No hyperdense vessels. Scattered calcifications at the carotid siphons Skull: Normal. Negative for fracture or focal lesion. Sinuses/Orbits: Mucosal thickening in the sphenoid and ethmoid and maxillary sinuses. No acute orbital abnormality. Old appearing deformities of the medial walls of the orbits. Other: None IMPRESSION: 1. No CT evidence for acute intracranial abnormality. 2. Atrophy and small vessel ischemic changes of the white matter Electronically Signed   By: Donavan Foil M.D.   On: 11/14/2017 02:37     Microbiology: No results found for this or any previous visit (from the past 240 hour(s)).   Labs: Basic Metabolic Panel: Recent Labs  Lab 11/14/17 0128 11/15/17 0451  NA 137 136  K 4.5 4.2  CL 113* 113*  CO2 16* 18*  GLUCOSE 237* 227*  BUN 25* 24*  CREATININE 1.16 1.12  CALCIUM 8.3* 8.0*   Liver Function Tests: Recent Labs  Lab 11/14/17 0128 11/15/17 0451  AST 26 19  ALT 27 23  ALKPHOS 104 79  BILITOT 1.7* 1.0  PROT 7.1 6.0*  ALBUMIN 2.8* 2.4*   No results for input(s): LIPASE, AMYLASE in the last 168 hours. Recent Labs  Lab 11/14/17 0128 11/15/17 1242  AMMONIA 143* 58*   CBC: Recent Labs  Lab 11/14/17 0128 11/15/17 0451  WBC 3.3* 1.7*  NEUTROABS 4.0  --   HGB 9.0* 8.2*  HCT 26.2* 24.2*  MCV 90.0 90.3  PLT 37* 32*   Cardiac Enzymes: Recent Labs  Lab 11/14/17 0128  TROPONINI <0.03   BNP: BNP (last 3 results) No results for input(s): BNP in the last 8760 hours.  ProBNP (last 3 results) No results for input(s): PROBNP in the last 8760 hours.  CBG: Recent Labs  Lab 11/14/17 1731 11/14/17 2200 11/15/17 0740 11/15/17 1109 11/15/17 1701  GLUCAP 290* 354* 179* 261* 287*       Signed:  Irine Seal MD.  [facility] 11/15/2017, 5:06 PM

## 2017-11-15 NOTE — Care Management Note (Signed)
Case Management Note  Patient Details  Name: Caleb Riley MRN: [id number] Date of Birth: 1951/03/15  Subjective/Objective:                 Admitted with encephelopathy. Pt is from home, lives alone, ind, has daughter who assists when needed. She picks up medications and puts them into daily medication boxes. Pt says he takes them as prescribed and has no difficulty affording medications. He asks that I call his daughter to verify.    Action/Plan: DC called daughter rebecca who says daughter victoria manages pt's medications but she believes victoria and pt have been complaint with all medications, specifically his lactulose. CM attempted to contact victoria at both numbers listed with no response. Anticipate DC home today.    Expected Discharge Date:  11/15/17               Expected Discharge Plan:  Home/Self Care  In-House Referral:  NA  Discharge planning Services  CM Consult  Post Acute Care Choice:  NA Choice offered to:  NA  DME Arranged:    DME Agency:     HH Arranged:    HH Agency:     Status of Service:  Completed, signed off  If discussed at [employer] of Stay Meetings, dates discussed:    Additional Comments:  Sherald Barge, RN 11/15/2017, 3:24 PM

## 2017-11-15 NOTE — Discharge Instructions (Signed)
Anlisis de amonaco (Ammonia Test) POR QU ME DEBO REALIZAR ESTE ANLISIS? El anlisis de [place] se [place] para ayudar a Retail buyer y Chief Technology Officer las enfermedades hepticas graves. [place], para diagnosticar y Chief Technology Officer un trastorno cerebral que puede aparecer en las personas que tienen enfermedades hepticas (encefalopata heptica). La concentracin de amonaco puede aumentar cuando el funcionamiento del hgado y los riones no es lo bastante bueno como para Counselling psychologist rea. La acumulacin de [employer] en el organismo puede provocar cambios mentales y neurolgicos que pueden causar confusin, desorientacin, somnolencia y, en ltima [place], [place] de coma e, incluso, la muerte. Los lactantes y los nios que tienen altas concentraciones de amonaco pueden vomitar con frecuencia, estar irritables y cada vez ms letrgicos. Sin tratamiento, pueden tener convulsiones y dificultad para Ambulance person, Dietitian en estado de coma y morir. QU TIPO DE MUESTRA SE TOMA? Se requiere [place] de sangre para [place]. Por lo general, para extraerla, se introduce una aguja en una vena. CMO DEBO PREPARARME PARA ESTE ANLISIS? [place] pediatra en cuanto a evitar lo siguiente antes de este anlisis:  Hacer actividad fsica.  Consumo de cigarrillos.  Algunos medicamentos. [place]? Los valores de referencia son los valores saludables establecidos despus de realizarle el anlisis a un grupo grande de personas sanas. Pueden variar [employer], laboratorios y hospitales. Es su responsabilidad retirar el resultado del [place]. Consulte en el laboratorio o en el departamento en el que fue realizado el estudio cundo y cmo podr [employer]. [place]? El aumento de las concentraciones de [employer] puede significar que usted o el nio tienen lo siguiente:  Enfermedad heptica.  Sangrado u obstruccin  gastrointestinal (GI).  Insuficiencia cardaca grave.  Enfermedad hemoltica del recin nacido.  Encefalopata heptica.  Un trastorno metablico gentico. La disminucin de las concentraciones de amonaco puede significar que usted o el nio tienen lo siguiente:  Presin arterial elevada (hipertensin arterial).  Un sndrome metablico gentico. Hable con el mdico sobre [address] [place], las opciones de tratamiento y, si es necesario, la necesidad de Optometrist ms [place]. Hable con el mdico si tiene [employer]. Esta informacin no tiene Marine scientist el consejo del mdico. Asegrese de hacerle al mdico cualquier pregunta que tenga. Document Released: 04/08/2014 Document Revised: 04/08/2014 Document Reviewed: 02/10/2014 Elsevier Interactive Patient Education  [employer].

## 2017-11-16 LAB — HIV ANTIBODY (ROUTINE TESTING W REFLEX): HIV Screen 4th Generation wRfx: NONREACTIVE

## 2017-11-26 ENCOUNTER — Other Ambulatory Visit: Payer: Self-pay | Admitting: "Endocrinology

## 2017-11-30 ENCOUNTER — Other Ambulatory Visit (HOSPITAL_COMMUNITY): Payer: Self-pay | Admitting: Podiatry

## 2017-11-30 ENCOUNTER — Ambulatory Visit (HOSPITAL_COMMUNITY)
Admission: RE | Admit: 2017-11-30 | Discharge: 2017-11-30 | Disposition: A | Discharge status: Home / Self Care | Payer: Medicare Other | Source: Ambulatory Visit | Attending: Podiatry | Admitting: Podiatry

## 2017-11-30 DIAGNOSIS — L97329 Non-pressure chronic ulcer of left ankle with unspecified severity: Secondary | ICD-10-CM | POA: Insufficient documentation

## 2017-11-30 DIAGNOSIS — E1142 Type 2 diabetes mellitus with diabetic polyneuropathy: Secondary | ICD-10-CM | POA: Diagnosis present

## 2017-11-30 DIAGNOSIS — E11621 Type 2 diabetes mellitus with foot ulcer: Secondary | ICD-10-CM | POA: Diagnosis not present

## 2017-12-12 ENCOUNTER — Other Ambulatory Visit: Payer: Self-pay | Admitting: "Endocrinology

## 2017-12-14 ENCOUNTER — Ambulatory Visit (HOSPITAL_COMMUNITY)
Admission: RE | Admit: 2017-12-14 | Discharge: 2017-12-14 | Disposition: A | Discharge status: Home / Self Care | Payer: Medicare Other | Source: Ambulatory Visit | Attending: Podiatry | Admitting: Podiatry

## 2017-12-14 ENCOUNTER — Other Ambulatory Visit (HOSPITAL_COMMUNITY): Payer: Self-pay | Admitting: Podiatry

## 2017-12-14 DIAGNOSIS — D179 Benign lipomatous neoplasm, unspecified: Secondary | ICD-10-CM | POA: Diagnosis not present

## 2017-12-14 DIAGNOSIS — M79605 Pain in left leg: Secondary | ICD-10-CM | POA: Diagnosis present

## 2017-12-14 DIAGNOSIS — M79662 Pain in left lower leg: Secondary | ICD-10-CM

## 2017-12-14 DIAGNOSIS — M7989 Other specified soft tissue disorders: Principal | ICD-10-CM

## 2018-02-07 ENCOUNTER — Ambulatory Visit: Payer: Medicare Other | Admitting: "Endocrinology

## 2018-02-25 ENCOUNTER — Other Ambulatory Visit: Payer: Self-pay | Admitting: "Endocrinology

## 2018-03-03 ENCOUNTER — Other Ambulatory Visit: Payer: Self-pay

## 2018-03-03 ENCOUNTER — Inpatient Hospital Stay (HOSPITAL_COMMUNITY)
Admission: EM | Admit: 2018-03-03 | Discharge: 2018-03-05 | DRG: 442 | Disposition: A | Discharge status: Home / Self Care | Payer: Medicare Other | Attending: Internal Medicine | Admitting: Internal Medicine

## 2018-03-03 ENCOUNTER — Encounter (HOSPITAL_COMMUNITY): Payer: Self-pay | Admitting: Emergency Medicine

## 2018-03-03 ENCOUNTER — Inpatient Hospital Stay (HOSPITAL_COMMUNITY): Payer: Medicare Other

## 2018-03-03 ENCOUNTER — Emergency Department (HOSPITAL_COMMUNITY): Payer: Medicare Other

## 2018-03-03 DIAGNOSIS — K219 Gastro-esophageal reflux disease without esophagitis: Secondary | ICD-10-CM | POA: Diagnosis present

## 2018-03-03 DIAGNOSIS — K7460 Unspecified cirrhosis of liver: Secondary | ICD-10-CM | POA: Diagnosis not present

## 2018-03-03 DIAGNOSIS — Z79891 Long term (current) use of opiate analgesic: Secondary | ICD-10-CM | POA: Diagnosis not present

## 2018-03-03 DIAGNOSIS — Z6833 Body mass index (BMI) 33.0-33.9, adult: Secondary | ICD-10-CM

## 2018-03-03 DIAGNOSIS — Z8249 Family history of ischemic heart disease and other diseases of the circulatory system: Secondary | ICD-10-CM | POA: Diagnosis not present

## 2018-03-03 DIAGNOSIS — R5383 Other fatigue: Secondary | ICD-10-CM | POA: Diagnosis present

## 2018-03-03 DIAGNOSIS — D61818 Other pancytopenia: Secondary | ICD-10-CM | POA: Diagnosis present

## 2018-03-03 DIAGNOSIS — Z79899 Other long term (current) drug therapy: Secondary | ICD-10-CM | POA: Diagnosis not present

## 2018-03-03 DIAGNOSIS — Z9114 Patient's other noncompliance with medication regimen: Secondary | ICD-10-CM | POA: Diagnosis not present

## 2018-03-03 DIAGNOSIS — E782 Mixed hyperlipidemia: Secondary | ICD-10-CM | POA: Diagnosis present

## 2018-03-03 DIAGNOSIS — I10 Essential (primary) hypertension: Secondary | ICD-10-CM | POA: Diagnosis present

## 2018-03-03 DIAGNOSIS — E669 Obesity, unspecified: Secondary | ICD-10-CM | POA: Diagnosis present

## 2018-03-03 DIAGNOSIS — R739 Hyperglycemia, unspecified: Secondary | ICD-10-CM | POA: Diagnosis not present

## 2018-03-03 DIAGNOSIS — F1010 Alcohol abuse, uncomplicated: Secondary | ICD-10-CM | POA: Diagnosis present

## 2018-03-03 DIAGNOSIS — D638 Anemia in other chronic diseases classified elsewhere: Secondary | ICD-10-CM | POA: Diagnosis present

## 2018-03-03 DIAGNOSIS — D696 Thrombocytopenia, unspecified: Secondary | ICD-10-CM | POA: Diagnosis not present

## 2018-03-03 DIAGNOSIS — E1165 Type 2 diabetes mellitus with hyperglycemia: Secondary | ICD-10-CM | POA: Diagnosis present

## 2018-03-03 DIAGNOSIS — R29700 NIHSS score 0: Secondary | ICD-10-CM | POA: Diagnosis present

## 2018-03-03 DIAGNOSIS — E1151 Type 2 diabetes mellitus with diabetic peripheral angiopathy without gangrene: Secondary | ICD-10-CM | POA: Diagnosis present

## 2018-03-03 DIAGNOSIS — Z794 Long term (current) use of insulin: Secondary | ICD-10-CM

## 2018-03-03 DIAGNOSIS — K7682 Hepatic encephalopathy: Secondary | ICD-10-CM

## 2018-03-03 DIAGNOSIS — K7030 Alcoholic cirrhosis of liver without ascites: Secondary | ICD-10-CM | POA: Diagnosis present

## 2018-03-03 DIAGNOSIS — K7290 Hepatic failure, unspecified without coma: Principal | ICD-10-CM | POA: Diagnosis present

## 2018-03-03 DIAGNOSIS — D6959 Other secondary thrombocytopenia: Secondary | ICD-10-CM | POA: Diagnosis present

## 2018-03-03 HISTORY — DX: Unspecified cirrhosis of liver: K74.60

## 2018-03-03 LAB — COMPREHENSIVE METABOLIC PANEL
ALK PHOS: 97 U/L (ref 38–126)
ALT: 30 U/L (ref 17–63)
AST: 33 U/L (ref 15–41)
Albumin: 2.9 g/dL — ABNORMAL LOW (ref 3.5–5.0)
Anion gap: 6 (ref 5–15)
BUN: 27 mg/dL — AB (ref 6–20)
CALCIUM: 8.8 mg/dL — AB (ref 8.9–10.3)
CHLORIDE: 115 mmol/L — AB (ref 101–111)
CO2: 18 mmol/L — AB (ref 22–32)
CREATININE: 1.23 mg/dL (ref 0.61–1.24)
GFR calc Af Amer: >60 mL/min (ref >60)
Glucose, Bld: 292 mg/dL — ABNORMAL HIGH (ref 65–99)
Potassium: 4.3 mmol/L (ref 3.5–5.1)
SODIUM: 139 mmol/L (ref 135–145)
Total Bilirubin: 1.9 mg/dL — ABNORMAL HIGH (ref 0.3–1.2)
Total Protein: 7.4 g/dL (ref 6.5–8.1)

## 2018-03-03 LAB — URINALYSIS, ROUTINE W REFLEX MICROSCOPIC
BILIRUBIN URINE: NEGATIVE
Glucose, UA: 50 mg/dL — AB
Hgb urine dipstick: NEGATIVE
KETONES UR: NEGATIVE mg/dL
LEUKOCYTES UA: NEGATIVE
NITRITE: NEGATIVE
PH: 6 (ref 5.0–8.0)
Protein, ur: NEGATIVE mg/dL
SPECIFIC GRAVITY, URINE: 1.013 (ref 1.005–1.030)

## 2018-03-03 LAB — CBC
HEMATOCRIT: 25.1 % — AB (ref 39.0–52.0)
Hemoglobin: 8.7 g/dL — ABNORMAL LOW (ref 13.0–17.0)
MCH: 31.2 pg (ref 26.0–34.0)
MCHC: 34.7 g/dL (ref 30.0–36.0)
MCV: 90 fL (ref 78.0–100.0)
Platelets: 34 10*3/uL — ABNORMAL LOW (ref 150–400)
RBC: 2.79 MIL/uL — ABNORMAL LOW (ref 4.22–5.81)
RDW: 14.7 % (ref 11.5–15.5)
WBC: 2.4 10*3/uL — ABNORMAL LOW (ref 4.0–10.5)

## 2018-03-03 LAB — DIFFERENTIAL
BASOS ABS: 0 10*3/uL (ref 0.0–0.1)
BASOS PCT: 0 %
Eosinophils Absolute: 0.1 10*3/uL (ref 0.0–0.7)
Eosinophils Relative: 5 %
Lymphocytes Relative: 21 %
Lymphs Abs: 0.5 10*3/uL — ABNORMAL LOW (ref 0.7–4.0)
MONOS PCT: 8 %
Monocytes Absolute: 0.2 10*3/uL (ref 0.1–1.0)
NEUTROS ABS: 1.6 10*3/uL — AB (ref 1.7–7.7)
Neutrophils Relative %: 66 %

## 2018-03-03 LAB — APTT: APTT: 34 s (ref 24–36)

## 2018-03-03 LAB — I-STAT CHEM 8, ED
BUN: 25 mg/dL — AB (ref 6–20)
CALCIUM ION: 1.22 mmol/L (ref 1.15–1.40)
CHLORIDE: 111 mmol/L (ref 101–111)
Creatinine, Ser: 1.3 mg/dL — ABNORMAL HIGH (ref 0.61–1.24)
GLUCOSE: 295 mg/dL — AB (ref 65–99)
HCT: 25 % — ABNORMAL LOW (ref 39.0–52.0)
Hemoglobin: 8.5 g/dL — ABNORMAL LOW (ref 13.0–17.0)
Potassium: 4.3 mmol/L (ref 3.5–5.1)
Sodium: 143 mmol/L (ref 135–145)
TCO2: 18 mmol/L — ABNORMAL LOW (ref 22–32)

## 2018-03-03 LAB — AMMONIA: AMMONIA: 104 umol/L — AB (ref 9–35)

## 2018-03-03 LAB — CBG MONITORING, ED: Glucose-Capillary: 282 mg/dL — ABNORMAL HIGH (ref 65–99)

## 2018-03-03 LAB — GLUCOSE, CAPILLARY
GLUCOSE-CAPILLARY: 218 mg/dL — AB (ref 65–99)
Glucose-Capillary: 216 mg/dL — ABNORMAL HIGH (ref 65–99)

## 2018-03-03 LAB — PROTIME-INR
INR: 1.37
Prothrombin Time: 16.8 seconds — ABNORMAL HIGH (ref 11.4–15.2)

## 2018-03-03 LAB — TROPONIN I: Troponin I: <0.03 ng/mL (ref <0.03)

## 2018-03-03 LAB — MRSA PCR SCREENING: MRSA BY PCR: NEGATIVE

## 2018-03-03 MED ORDER — TAMSULOSIN HCL 0.4 MG PO CAPS
0.4000 mg | ORAL_CAPSULE | Freq: Every day | ORAL | Status: DC
  Administered 2018-03-03 – 2018-03-04 (×2): 0.4 mg via ORAL
  Filled 2018-03-03 (×2): qty 1

## 2018-03-03 MED ORDER — ATORVASTATIN CALCIUM 10 MG PO TABS: 10.0000 mg | ORAL_TABLET | Freq: Every day | ORAL | Status: DC

## 2018-03-03 MED ORDER — INSULIN ASPART 100 UNIT/ML ~~LOC~~ SOLN
6.0000 [IU] | Freq: Three times a day (TID) | SUBCUTANEOUS | Status: DC
Start: 2018-03-03 — End: 2018-03-04
  Administered 2018-03-03 – 2018-03-04 (×2): 6 [IU] via SUBCUTANEOUS

## 2018-03-03 MED ORDER — RIFAXIMIN 550 MG PO TABS
550.0000 mg | ORAL_TABLET | Freq: Two times a day (BID) | ORAL | Status: DC
  Administered 2018-03-03 – 2018-03-05 (×4): 550 mg via ORAL
  Filled 2018-03-03 (×4): qty 1

## 2018-03-03 MED ORDER — PANTOPRAZOLE SODIUM 40 MG PO TBEC
40.0000 mg | DELAYED_RELEASE_TABLET | Freq: Every day | ORAL | Status: DC
  Administered 2018-03-04 – 2018-03-05 (×2): 40 mg via ORAL
  Filled 2018-03-03 (×2): qty 1

## 2018-03-03 MED ORDER — ATORVASTATIN CALCIUM 10 MG PO TABS
10.0000 mg | ORAL_TABLET | Freq: Every day | ORAL | Status: DC
  Administered 2018-03-04: 10 mg via ORAL
  Filled 2018-03-03: qty 1

## 2018-03-03 MED ORDER — LACTULOSE 10 GM/15ML PO SOLN
30.0000 g | Freq: Three times a day (TID) | ORAL | Status: DC
  Administered 2018-03-03 – 2018-03-04 (×3): 30 g via ORAL
  Filled 2018-03-03 (×3): qty 60

## 2018-03-03 MED ORDER — INSULIN ASPART 100 UNIT/ML ~~LOC~~ SOLN: 4.0000 [IU] | Freq: Three times a day (TID) | SUBCUTANEOUS | Status: DC

## 2018-03-03 MED ORDER — SPIRONOLACTONE 25 MG PO TABS
25.0000 mg | ORAL_TABLET | Freq: Two times a day (BID) | ORAL | Status: DC
  Administered 2018-03-03 – 2018-03-05 (×4): 25 mg via ORAL
  Filled 2018-03-03 (×4): qty 1

## 2018-03-03 MED ORDER — INSULIN DETEMIR 100 UNIT/ML ~~LOC~~ SOLN
40.0000 [IU] | Freq: Every day | SUBCUTANEOUS | Status: DC
  Administered 2018-03-03: 40 [IU] via SUBCUTANEOUS
  Filled 2018-03-03 (×2): qty 0.4

## 2018-03-03 MED ORDER — INSULIN ASPART 100 UNIT/ML ~~LOC~~ SOLN
0.0000 [IU] | Freq: Every day | SUBCUTANEOUS | Status: DC
  Administered 2018-03-03 – 2018-03-04 (×2): 2 [IU] via SUBCUTANEOUS

## 2018-03-03 MED ORDER — PROPRANOLOL HCL 20 MG PO TABS
20.0000 mg | ORAL_TABLET | Freq: Two times a day (BID) | ORAL | Status: DC
  Administered 2018-03-03 – 2018-03-05 (×4): 20 mg via ORAL
  Filled 2018-03-03 (×2): qty 2
  Filled 2018-03-03: qty 1
  Filled 2018-03-03 (×3): qty 2
  Filled 2018-03-03: qty 1
  Filled 2018-03-03: qty 2
  Filled 2018-03-03: qty 1

## 2018-03-03 MED ORDER — INSULIN ASPART 100 UNIT/ML ~~LOC~~ SOLN
0.0000 [IU] | Freq: Three times a day (TID) | SUBCUTANEOUS | Status: DC
  Administered 2018-03-03 – 2018-03-04 (×3): 5 [IU] via SUBCUTANEOUS
  Administered 2018-03-05: 11 [IU] via SUBCUTANEOUS
  Administered 2018-03-05: 3 [IU] via SUBCUTANEOUS

## 2018-03-03 MED ORDER — SODIUM CHLORIDE 0.9 % IV BOLUS
500.0000 mL | Freq: Once | INTRAVENOUS | Status: AC
Start: 2018-03-03 — End: 2018-03-03
  Administered 2018-03-03: 500 mL via INTRAVENOUS

## 2018-03-03 NOTE — ED Notes (Addendum)
Code Stroke paged out 

## 2018-03-03 NOTE — Progress Notes (Signed)
Has on lower anterior left leg old healing wound. At bottom of wound patient has hit leg on something and had small less than 1 cm by 1cm skin tear. Multiple areas appeared to have skin removed for patching other areas.

## 2018-03-03 NOTE — Consult Note (Signed)
[place] Nurse wound consult note Reason for Consult: left LE wounds, partial and full thickness. Patient has had an extensive skin graft on the left LE. Bumped his LE this morning and a small partial thickness wound is now apparent, other wounds were preexisting. Wound type: trauma, chronic, non-healing Pressure Injury POA: Yes/No/NA Measurement: Left pretibial: Two wounds, the larger and superior measures 2.5cm x 3cm x 0.1cm and the smaller measures 1,5cn x 1cmn x 0.1cm.  Both are pink and moist. Distal LE with linear partial thickness wound from injury this morning measuring 3cm x 0.2cm x 0.1cm with red, moist wound bed. Anterior foot with 2.8cn x 1.5cm x 0.5cm chronic wound with red, moist wound bed. Lateral foot with 1cm round x 0.1cm pink, moist wound. Wound bed:As described above Drainage (amount, consistency, odor) All wounds with scant serous exudate Periwound: The large left LE skin graft has about a 90% take.  I do not know when it was performed or for what reason/injury but it is significant. The donor sites appear to be from the bilateral hips and those have well healed. Dressing procedure/placement/frequency: I will cover the partial thickness wounds with xeroform gauze and secure with a few turns of Kerlix roll gaze dressing leaving no medical adhesive tape applied to skin. The anterior foot wound will require filling with a silver hydrofiber because of its depth.  Bedside RN is present for my assessment and is in agreement with the POC.  [place] nursing team will not follow, but will remain available to this patient, the nursing and medical teams.  Please re-consult if needed. Thanks, Maudie Flakes, MSN, RN, [place], Arther Abbott  Pager# [phone]

## 2018-03-03 NOTE — Consult Note (Signed)
[facility] TeleNeurology Consult Services  Asked to see this patient in telemedicine consultation. Consultation was performed with assistance of ancillary/medical staff at bedside.  Comments: Last Known normal  Last night Door Time: 915 [facility] Contacted:  801 [facility] first log in: 943 NIHSS assessment time: 1000 Call back time: 1001 Needle Time: no iv tpa  HPI: 53 yom with hx of diabetes, htn, cirrhosis, hyperlipidemia presents with confusion and hand weakness.  He was last seen normal by family last night and they noted confusion this am.  He has had similar presentation few months ago and was diagnosed with elevated ammonia level at that time.  He is doing better now and is more coherent per d/w family.   CT scan head with no acute findings.  VSS  Gen Wn/Wd in Nad  TeleStroke Assessment: LOC:   0 LOC questions:  0 LOC Commands :   0 Gaze : 0 Visual fields :  0  Facial movements : 0 Upper limb Motor  0 Lower limb Motor  0 Limb Coordination  - 0 Sensory -  - 0 Language -  0 Speech -   0 Neglect / extinction -  0  NIHSS Score: 0    IMPRESSION  AMS suspect metabolic encephalopathy  Hx of cirrhosis Thrombocytopenia.    Medical Decision Making:   Patient is not candidate for alteplase due to non focal / localizing exam  Not an IR candidate as low clinical suspicion for LVO by neurologic assessment.   Recommendations: - Daily antithrombotics on hold d/t low plts.  - Further work up for metabolic and infectious etiology per ED - Thank you for allowing [place] to participate in the care of your patient, if there are any questions please don't hesitate to contact us  Discussed plan of care with patient/family/hospital staff   Physician: Sylvan Cheese, DO   [facility]

## 2018-03-03 NOTE — ED Notes (Signed)
Medical Records release faxed to [facility] in [place] to [employer]

## 2018-03-03 NOTE — ED Notes (Signed)
Code Stroke paged out 

## 2018-03-03 NOTE — H&P (Signed)
History and Physical  Caleb Riley ZWC:[id number] DOB: 1951/05/19 DOA: 03/03/2018  Referring physician: Evalee Jefferson, PA-C PCP: Vidal Schwalbe, MD    Chief Complaint: Altered Mental Status  HPI: Caleb Riley is a 67 y.o. 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 The patient woke up this morning around 4 am with increased confusion. Family states that patient was at baseline when he went to bed the previous night at 9 pm. Family also states that patient's normal baseline is mildly confused due to cirrhosis of the liver. Due to patient's confusion and reports of feeling weak EMS was called and patient was sent to ED with possible code stroke. Patient denies pain. He is a poor historian for his own health history.  In the ED a code stroke was called and had evaluation by tele neurology. The neurologist reports non-focal symptoms. A CT of the head was negative for acute stroke. Neurologist suggests symptoms due to hepatic encephalopathy rather than stroke. Code Stroke canceled in ED. In patient admission sought for patient to stepdown unit.    Review of Systems: All systems reviewed and apart from history of presenting illness, are negative.  Past Medical History:  Diagnosis Date  . Diabetes mellitus without complication ([place])   . Diarrhea, travelers'   . GERD (gastroesophageal reflux disease)   . Hypertension   . Liver cirrhosis ([place])   . Staphylococcus aureus bacteremia    Past Surgical History:  Procedure Laterality Date  . EYE SURGERY    . i and d    . SKIN GRAFT     Social History:  reports that he has never smoked. He has never used smokeless tobacco. He reports that he does not drink alcohol or use drugs.  No Known Allergies  Family History  Problem Relation Age of Onset  . Hypertension Mother     Prior to Admission medications   Medication Sig Start Date End Date Taking? Authorizing Provider    atorvastatin (LIPITOR) 10 MG tablet Take 1 tablet by mouth daily. 10/10/17  Yes [provider]  Continuous Blood Gluc Sensor (FREESTYLE LIBRE SENSOR SYSTEM) MISC Use one sensor every 10 days. 07/06/17  Yes Nida, Marella Chimes, MD  lactulose (CHRONULAC) 10 GM/15ML solution Take 45 mLs (30 g total) by mouth 3 (three) times daily. 11/15/17  Yes Eugenie Filler, MD  LEVEMIR FLEXTOUCH 100 UNIT/ML Pen INJECT 40 UNITS S.Q. ONCE DAILY AT 10 P.M. 12/13/17  Yes Nida, Marella Chimes, MD  NOVOLOG FLEXPEN 100 UNIT/ML FlexPen INJECT 15 TO 20 UNITS UNDER THE SKIN THREE TIMES DAILY WITH MEALS. 02/25/18  Yes Nida, Marella Chimes, MD  omeprazole (PRILOSEC) 20 MG capsule Take 20 mg by mouth daily.   Yes [provider]  propranolol (INDERAL) 20 MG tablet Take 1 tablet (20 mg total) by mouth 2 (two) times daily. 09/24/13  Yes Samuella Cota, MD  rifaximin (XIFAXAN) 550 MG TABS tablet Take 550 mg by mouth 2 (two) times daily.   Yes [provider]  spironolactone (ALDACTONE) 25 MG tablet Take 25 mg by mouth 2 (two) times daily.    Yes [provider]  tamsulosin (FLOMAX) 0.4 MG CAPS capsule Take 0.4 mg by mouth daily.   Yes [provider]  traMADol (ULTRAM) 50 MG tablet Take 1-2 tablets by mouth 3 (three) times daily as needed. 01/07/18  Yes [provider]  zinc sulfate 220 (50 Zn) MG capsule Take 220 mg  by mouth daily.   Yes [provider]   Physical Exam: Vitals:   03/03/18 1145 03/03/18 1200 03/03/18 1215 03/03/18 1224  BP: 130/62 (!) 148/71 (!) 145/64   Pulse: 61 68 (!) 32   Resp: 16 16 (!) 26   Temp:    98.5 F (36.9 C)  TempSrc:    Oral  SpO2: 100% 100% 100%   Weight:      Height:         General exam: Moderately built and nourished patient, lying comfortably supine on the gurney in no obvious distress. Patient is oriented and alert x 3.   Head, eyes and ENT: Nontraumatic and normocephalic. Pupils equally reacting to light and  accommodation. Oral mucosa moist.  Neck: Supple. No JVD, carotid bruit or thyromegaly.  Lymphatics: No lymphadenopathy.  Respiratory system: Clear to auscultation. No increased work of breathing.  Cardiovascular system: S1 and S2 heard, RRR. No JVD, murmurs, gallops, clicks or pedal edema.  Gastrointestinal system: Abdomen is protuberant and nondistended, and soft. Mild tenderness in mid right side. Normal bowel sounds heard. No organomegaly or masses appreciated.  Central nervous system: Alert and oriented. No focal neurological deficits.  Extremities: Symmetric 5 x 5 power. Peripheral pulses symmetrically felt. Abrasions to left anterior lower extremity measuring 3 cm x 0.5 cm and left dorsal foot.    Skin: No rashes or acute findings.  Musculoskeletal system: Negative exam.  Psychiatry: Pleasant and cooperative.  Labs on Admission:  Basic Metabolic Panel: Recent Labs  Lab 03/03/18 0948 03/03/18 1008  NA 143 139  K 4.3 4.3  CL 111 115*  CO2  --  18*  GLUCOSE 295* 292*  BUN 25* 27*  CREATININE 1.30* 1.23  CALCIUM  --  8.8*   Liver Function Tests: Recent Labs  Lab 03/03/18 1008  AST 33  ALT 30  ALKPHOS 97  BILITOT 1.9*  PROT 7.4  ALBUMIN 2.9*   No results for input(s): LIPASE, AMYLASE in the last 168 hours. Recent Labs  Lab 03/03/18 1008  AMMONIA 104*   CBC: Recent Labs  Lab 03/03/18 0948 03/03/18 1008  WBC  --  2.4*  NEUTROABS  --  1.6*  HGB 8.5* 8.7*  HCT 25.0* 25.1*  MCV  --  90.0  PLT  --  34*   Cardiac Enzymes: Recent Labs  Lab 03/03/18 1008  TROPONINI <0.03    BNP (last 3 results) No results for input(s): PROBNP in the last 8760 hours. CBG: Recent Labs  Lab 03/03/18 1000  GLUCAP 282*    Radiological Exams on Admission: Ct Head Code Stroke Wo Contrast`  Result Date: 03/03/2018 CLINICAL DATA:  Code stroke. 67 year old male who woke with confusion, falls, dizziness, left side weakness. History of cirrhosis and hepatic  encephalopathy. EXAM: CT HEAD WITHOUT CONTRAST TECHNIQUE: Contiguous axial images were obtained from the base of the skull through the vertex without intravenous contrast. COMPARISON:  11/14/2017 noncontrast head CT and earlier. FINDINGS: Brain: Stable cerebral volume. Stable gray-white matter differentiation throughout the brain. Mild-to-moderate bilateral patchy white matter hypodensity. No midline shift, ventriculomegaly, mass effect, evidence of mass lesion, intracranial hemorrhage or evidence of cortically based acute infarction. No cortical encephalomalacia identified. Vascular: Calcified atherosclerosis at the skull base. No suspicious intracranial vascular hyperdensity. Skull: No acute osseous abnormality identified. Sinuses/Orbits: Stable mild mucosal thickening. Tympanic cavities and mastoids remain clear. Other: Visualized orbits and scalp soft tissues are within normal limits. ASPECTS [facility] Stroke Program Early CT Score) - Ganglionic level infarction (caudate, lentiform  nuclei, internal capsule, insula, M1-M3 cortex): 7 - Supraganglionic infarction (M4-M6 cortex): 3 Total score (0-10 with 10 being normal): 10 IMPRESSION: 1. No acute cortically based infarct or intracranial hemorrhage identified. ASPECTS is 10. 2. Stable chronic patchy cerebral white matter hypodensity, most commonly due to chronic small vessel disease. 3. Study discussed by telephone with JULIE IDOL PA-C in the ED on 03/03/2018 at 10:05 . Electronically Signed   By: Genevie Ann M.D.   On: 03/03/2018 10:06    EKG: Independently reviewed.   Assessment/Plan Principal Problem:   Hepatic encephalopathy (HCC) Active Problems:   Pancytopenia (HCC)   Cirrhosis of liver ([place])   Uncontrolled type 2 diabetes mellitus with hyperglycemia (HCC)   Anemia, chronic disease   Thrombocytopenia (HCC)   Lethargy   Essential hypertension, benign   Mixed hyperlipidemia   GERD (gastroesophageal reflux disease)   1. Hepatic Encephalopathy - will  continue lactulose 10 gm/15 mL solution by mouth 3 times a day to reduce ammonia level. Current labs show patient's ammonia level at 104. Repeat ammonia tomorrow morning. Portable chest xray ordered to rule out infection results pending. Blood cultures ordered results pending. Patient admitted to stepdown pending resolution of encephalopathy. 2. Pancytopenia - possibly related to chronic anemia and alcoholic cirrhosis of the liver. Will monitor while inpatient.  Repeat CBC in the morning. 3. Cirrhosis of liver - Continue home lactulose and rifaxim. Monitor ammonia in the morning. 4. Uncontrolled type 2 diabetes mellitus with hyperglycemia - Insulin coverage of 6 units three times per day. Patient placed on sliding scale 0-15 units three times a day with meals. 0-5 units before sleep. Levemir 40 units SQ at 2200. 5. Anemia - Patient's hemoglobin on admission from ED is 8.7 with hematocrit of 25.1. Patient will be closely monitored with repeat CBC in the morning. 6. Thrombocytopenia - Secondary to liver failure.Platelets 34 K/uL Monitor for signs of spontaneous bleeding. Repeat CBC studies in the morning. No active bleeding at this time. 7. Lethargy - lethargic in the ED. Per family patient missed his dose of lactulose previous night, but did get it early this morning. On assessment patient showed marked improvement over initial assessment in ED. Possibly due to resuming normal lactulose dosage. 8. Essential hypertension, benign - Continue home medications while in-patient. 9. Mixed hyperlipidemia - Continue home medications while in-patient. 10. GERD - Substitute treatment with pantoprazole EC 40 mg once per day.   DVT Prophylaxis: SCD Code Status: FULL CODE  Family Communication: Patient  Disposition Plan: Pending return to patient's baseline. Expect at least 4 days if not sooner.   Time spent: 60 minutes  Ashok Norris, Student AGACNP  Attending:  Irwin Brakeman, MD [facility] Pager [phone]  If 7PM-7AM, please contact night-coverage [email or web address] Password [facility] 03/03/2018, 12:29 PM

## 2018-03-03 NOTE — ED Triage Notes (Signed)
Per family patient woke this morning with confusion and falling. Patient reports weakness on left side (then both sides) and dizziness. Gait unsteady while walking to room. Patient also reports difficulty with words. Per family this is "how patient gets when his ammonia levels are elevated." Patient has taking lactulose this morning. No facial drooping or slurred speech noted. Patient's last well known per family was at 9pm last night. Patient VAN positive, Code Stroke called, cart in room.

## 2018-03-03 NOTE — ED Provider Notes (Signed)
[facility] EMERGENCY DEPARTMENT Provider Note   CSN: [id number] Arrival date & time: 03/03/18  0915     History   Chief Complaint Chief Complaint  Patient presents with  . Code Stroke    HPI Caleb Riley is a 67 y.o. 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  Daughter and son-in-law at the bedside report he was his normal self at 79 PM when he went to sleep last night, but they endorse his normal self is mildly confused secondary to his ongoing cirrhosis issues, recently had his lactulose increased to 3 times daily dosing.  When he woke at 4 AM he was confused, and at first he reported feeling weak in one arm, then feeling weak in both arms.  Has had no treatment prior to arrival.  LEVEL V CAVEAT.  The history is provided by a relative.    Past Medical History:  Diagnosis Date  . Diabetes mellitus without complication ([place])   . Diarrhea, travelers'   . GERD (gastroesophageal reflux disease)   . Hypertension   . Liver cirrhosis ([place])   . Staphylococcus aureus bacteremia     Patient Active Problem List   Diagnosis Date Noted  . Anemia, chronic disease 03/03/2018  . Thrombocytopenia ([place]) 03/03/2018  . Hepatic encephalopathy ([place]) 11/14/2017  . GERD (gastroesophageal reflux disease) 11/14/2017  . Uncontrolled type 2 diabetes mellitus with hyperglycemia ([place]) 08/09/2017  . Mixed hyperlipidemia 07/06/2017  . Obesity 09/21/2013  . Cirrhosis of liver ([place]) 09/20/2013  . Lethargy 09/19/2013  . Essential hypertension, benign 09/19/2013  . Pancytopenia ([place]) 09/19/2013    Past Surgical History:  Procedure Laterality Date  . EYE SURGERY    . i and d    . SKIN GRAFT          Home Medications    Prior to Admission medications   Medication Sig Start Date End Date Taking? Authorizing Provider  Continuous Blood Gluc  Sensor (FREESTYLE LIBRE SENSOR SYSTEM) MISC Use one sensor every 10 days. 07/06/17  Yes Nida, Marella Chimes, MD  furosemide (LASIX) 20 MG tablet Take 1 tablet by mouth daily. 02/19/14  Yes [provider]  lactulose (CHRONULAC) 10 GM/15ML solution Take 45 mLs (30 g total) by mouth 3 (three) times daily. 11/15/17  Yes Eugenie Filler, MD  LEVEMIR FLEXTOUCH 100 UNIT/ML Pen INJECT 40 UNITS S.Q. ONCE DAILY AT 10 P.M. 12/13/17  Yes Nida, Marella Chimes, MD  NOVOLOG FLEXPEN 100 UNIT/ML FlexPen INJECT 15 TO 20 UNITS UNDER THE SKIN THREE TIMES DAILY WITH MEALS. 02/25/18  Yes Nida, Marella Chimes, MD  rifaximin (XIFAXAN) 550 MG TABS tablet Take 550 mg by mouth 2 (two) times daily.   Yes [provider]  Vitamin D, Ergocalciferol, (DRISDOL) 50000 units CAPS capsule Take 50,000 Units by mouth every 7 (seven) days.   Yes [provider]  atorvastatin (LIPITOR) 10 MG tablet Take 1 tablet by mouth daily. 10/10/17   [provider]  losartan (COZAAR) 100 MG tablet Take 1 tablet by mouth daily.    [provider]  omeprazole (PRILOSEC) 20 MG capsule Take 20 mg by mouth daily.    [provider]  propranolol (INDERAL) 20 MG tablet Take 1 tablet (20 mg total) by mouth 2 (two) times daily. 09/24/13   Samuella Cota, MD  spironolactone (ALDACTONE) 25 MG tablet Take 25 mg by mouth daily.  [provider]  tamsulosin (FLOMAX) 0.4 MG CAPS capsule Take 0.4 mg by mouth daily.    [provider]  traMADol (ULTRAM) 50 MG tablet Take 1-2 tablets by mouth 3 (three) times daily as needed. 01/07/18   [provider]    Family History Family History  Problem Relation Age of Onset  . Hypertension Mother     Social History Social History   Tobacco Use  . Smoking status: Never Smoker  . Smokeless tobacco: Never Used  Substance Use Topics  . Alcohol use: No    Comment: former alcoholic  . Drug use: No     Allergies   Patient has  no known allergies.   Review of Systems Review of Systems  Unable to perform ROS: Mental status change  Eyes: Negative.   Gastrointestinal: Negative for vomiting.  Genitourinary: Negative.   Neurological: Positive for weakness.  Psychiatric/Behavioral: Positive for confusion.     Physical Exam Updated Vital Signs BP 124/61   Pulse (!) 57   Temp 98.3 F (36.8 C)   Resp 18   Ht 5\' 7"  (1.702 m)   Wt 97.5 kg (215 lb)   SpO2 100%   BMI 33.67 kg/m   Physical Exam  Constitutional: He appears well-developed and well-nourished.  HENT:  Head: Normocephalic and atraumatic.  Eyes: EOM are normal.  Cardiovascular: Normal rate, regular rhythm, normal heart sounds and intact distal pulses.  Pulmonary/Chest: Effort normal and breath sounds normal. He has no wheezes.  Abdominal: Soft. Bowel sounds are normal. He exhibits distension. There is no tenderness.  Musculoskeletal: Normal range of motion.  Neurological:  Pt moving all 4 extremities without difficulty.  No slurred speech although is confused to place and time.  Skin: Skin is warm and dry.  Psychiatric: He has a normal mood and affect.  Nursing note and vitals reviewed.    ED Treatments / Results  Labs (all labs ordered are listed, but only abnormal results are displayed) Labs Reviewed  PROTIME-INR - Abnormal; Notable for the following components:      Result Value   Prothrombin Time 16.8 (*)    All other components within normal limits  CBC - Abnormal; Notable for the following components:   WBC 2.4 (*)    RBC 2.79 (*)    Hemoglobin 8.7 (*)    HCT 25.1 (*)    Platelets 34 (*)    All other components within normal limits  DIFFERENTIAL - Abnormal; Notable for the following components:   Neutro Abs 1.6 (*)    Lymphs Abs 0.5 (*)    All other components within normal limits  COMPREHENSIVE METABOLIC PANEL - Abnormal; Notable for the following components:   Chloride 115 (*)    CO2 18 (*)    Glucose, Bld 292 (*)     BUN 27 (*)    Calcium 8.8 (*)    Albumin 2.9 (*)    Total Bilirubin 1.9 (*)    All other components within normal limits  AMMONIA - Abnormal; Notable for the following components:   Ammonia 104 (*)    All other components within normal limits  CBG MONITORING, ED - Abnormal; Notable for the following components:   Glucose-Capillary 282 (*)    All other components within normal limits  I-STAT CHEM 8, ED - Abnormal; Notable for the following components:   BUN 25 (*)    Creatinine, Ser 1.30 (*)    Glucose, Bld 295 (*)    TCO2 18 (*)  Hemoglobin 8.5 (*)    HCT 25.0 (*)    All other components within normal limits  APTT  TROPONIN I    EKG None  Radiology Ct Head Code Stroke Wo Contrast`  Result Date: 03/03/2018 CLINICAL DATA:  Code stroke. 67 year old male who woke with confusion, falls, dizziness, left side weakness. History of cirrhosis and hepatic encephalopathy. EXAM: CT HEAD WITHOUT CONTRAST TECHNIQUE: Contiguous axial images were obtained from the base of the skull through the vertex without intravenous contrast. COMPARISON:  11/14/2017 noncontrast head CT and earlier. FINDINGS: Brain: Stable cerebral volume. Stable gray-white matter differentiation throughout the brain. Mild-to-moderate bilateral patchy white matter hypodensity. No midline shift, ventriculomegaly, mass effect, evidence of mass lesion, intracranial hemorrhage or evidence of cortically based acute infarction. No cortical encephalomalacia identified. Vascular: Calcified atherosclerosis at the skull base. No suspicious intracranial vascular hyperdensity. Skull: No acute osseous abnormality identified. Sinuses/Orbits: Stable mild mucosal thickening. Tympanic cavities and mastoids remain clear. Other: Visualized orbits and scalp soft tissues are within normal limits. ASPECTS [facility] Stroke Program Early CT Score) - Ganglionic level infarction (caudate, lentiform nuclei, internal capsule, insula, M1-M3 cortex): 7 -  Supraganglionic infarction (M4-M6 cortex): 3 Total score (0-10 with 10 being normal): 10 IMPRESSION: 1. No acute cortically based infarct or intracranial hemorrhage identified. ASPECTS is 10. 2. Stable chronic patchy cerebral white matter hypodensity, most commonly due to chronic small vessel disease. 3. Study discussed by telephone with Surina Storts PA-C in the ED on 03/03/2018 at 10:05 . Electronically Signed   By: Genevie Ann M.D.   On: 03/03/2018 10:06    Procedures Procedures (including critical care time)  Medications Ordered in ED Medications  sodium chloride 0.9 % bolus 500 mL (500 mLs Intravenous New Bag/Given 03/03/18 1127)     Initial Impression / Assessment and Plan / ED Course  I have reviewed the triage vital signs and the nursing notes.  Pertinent labs & imaging results that were available during my care of the patient were reviewed by me and considered in my medical decision making (see chart for details).     Patient was initially called a code stroke and was evaluated by neurology who agrees that the patient has nonfocal symptoms.  His head CT was also negative for acute stroke.  Exam nonfocal, confused, c/w worsened hepatic encephalopathy.  Family states he received his last dose of lactulose this am prior to arrival.  He also has elevated blood glucose without significant anion gap.  Gentle IV fluids given.  Discussed with Dr. Wynetta Emery with the hospitalist group who will admit pt.  No critical care time.  Final Clinical Impressions(s) / ED Diagnoses   Final diagnoses:  Hepatic encephalopathy [facility])  Hyperglycemia    ED Discharge Orders    None       Landis Martins 03/03/18 1218    Nat Christen, MD 03/03/18 1630

## 2018-03-03 NOTE — Progress Notes (Signed)
CODE STROKE 0936 am scanner time - Beeper [phone] exam began  Order changedl 4270 exam finished images sent to [facility]. [id number] exam completed in [place] called [facility], spoke with Carloyn Manner.

## 2018-03-04 ENCOUNTER — Ambulatory Visit: Payer: Medicare Other | Admitting: "Endocrinology

## 2018-03-04 DIAGNOSIS — D696 Thrombocytopenia, unspecified: Secondary | ICD-10-CM

## 2018-03-04 DIAGNOSIS — I10 Essential (primary) hypertension: Secondary | ICD-10-CM

## 2018-03-04 DIAGNOSIS — R739 Hyperglycemia, unspecified: Secondary | ICD-10-CM

## 2018-03-04 DIAGNOSIS — D61818 Other pancytopenia: Secondary | ICD-10-CM

## 2018-03-04 DIAGNOSIS — K7030 Alcoholic cirrhosis of liver without ascites: Secondary | ICD-10-CM

## 2018-03-04 DIAGNOSIS — K7290 Hepatic failure, unspecified without coma: Principal | ICD-10-CM

## 2018-03-04 DIAGNOSIS — E1165 Type 2 diabetes mellitus with hyperglycemia: Secondary | ICD-10-CM

## 2018-03-04 LAB — CBC
HEMATOCRIT: 23.3 % — AB (ref 39.0–52.0)
HEMOGLOBIN: 8 g/dL — AB (ref 13.0–17.0)
MCH: 31.4 pg (ref 26.0–34.0)
MCHC: 34.3 g/dL (ref 30.0–36.0)
MCV: 91.4 fL (ref 78.0–100.0)
Platelets: 36 10*3/uL — ABNORMAL LOW (ref 150–400)
RBC: 2.55 MIL/uL — AB (ref 4.22–5.81)
RDW: 15 % (ref 11.5–15.5)
WBC: 1.9 10*3/uL — ABNORMAL LOW (ref 4.0–10.5)

## 2018-03-04 LAB — COMPREHENSIVE METABOLIC PANEL
ALBUMIN: 2.5 g/dL — AB (ref 3.5–5.0)
ALK PHOS: 83 U/L (ref 38–126)
ALT: 28 U/L (ref 17–63)
AST: 30 U/L (ref 15–41)
Anion gap: 4 — ABNORMAL LOW (ref 5–15)
BILIRUBIN TOTAL: 1.6 mg/dL — AB (ref 0.3–1.2)
BUN: 22 mg/dL — AB (ref 6–20)
CALCIUM: 8.4 mg/dL — AB (ref 8.9–10.3)
CO2: 19 mmol/L — ABNORMAL LOW (ref 22–32)
Chloride: 117 mmol/L — ABNORMAL HIGH (ref 101–111)
Creatinine, Ser: 1.21 mg/dL (ref 0.61–1.24)
GFR calc Af Amer: >60 mL/min (ref >60)
GLUCOSE: 229 mg/dL — AB (ref 65–99)
Potassium: 3.8 mmol/L (ref 3.5–5.1)
Sodium: 140 mmol/L (ref 135–145)
TOTAL PROTEIN: 6.7 g/dL (ref 6.5–8.1)

## 2018-03-04 LAB — GLUCOSE, CAPILLARY
GLUCOSE-CAPILLARY: 251 mg/dL — AB (ref 65–99)
GLUCOSE-CAPILLARY: 235 mg/dL — AB (ref 65–99)
GLUCOSE-CAPILLARY: 102 mg/dL — AB (ref 65–99)
GLUCOSE-CAPILLARY: 201 mg/dL — AB (ref 65–99)
Glucose-Capillary: 224 mg/dL — ABNORMAL HIGH (ref 65–99)

## 2018-03-04 LAB — AMMONIA: AMMONIA: 131 umol/L — AB (ref 9–35)

## 2018-03-04 MED ORDER — SODIUM CHLORIDE 0.9 % IV SOLN
INTRAVENOUS | Status: AC
  Administered 2018-03-04 – 2018-03-05 (×2): via INTRAVENOUS

## 2018-03-04 MED ORDER — INSULIN DETEMIR 100 UNIT/ML ~~LOC~~ SOLN
43.0000 [IU] | Freq: Every day | SUBCUTANEOUS | Status: DC
  Administered 2018-03-04: 43 [IU] via SUBCUTANEOUS
  Filled 2018-03-04 (×2): qty 0.43

## 2018-03-04 MED ORDER — LACTULOSE 10 GM/15ML PO SOLN
30.0000 g | Freq: Four times a day (QID) | ORAL | Status: DC
  Administered 2018-03-04 – 2018-03-05 (×5): 30 g via ORAL
  Filled 2018-03-04 (×5): qty 60

## 2018-03-04 MED ORDER — INSULIN ASPART 100 UNIT/ML ~~LOC~~ SOLN
10.0000 [IU] | Freq: Three times a day (TID) | SUBCUTANEOUS | Status: DC
  Administered 2018-03-04 – 2018-03-05 (×4): 10 [IU] via SUBCUTANEOUS

## 2018-03-04 NOTE — Progress Notes (Signed)
Inpatient Diabetes Program Recommendations  AACE/ADA: New Consensus Statement on Inpatient Glycemic Control (2015)  Target Ranges:  Prepandial:   less than 140 mg/dL      Peak postprandial:   less than 180 mg/dL (1-2 hours)      Critically ill patients:  140 - 180 mg/dL   Results for Caleb Riley, Caleb Riley (MRN [id number]) as of 03/04/2018 08:59  Ref. Range 03/03/2018 10:00 03/03/2018 16:23 03/03/2018 19:53 03/04/2018 02:42 03/04/2018 08:43  Glucose-Capillary Latest Ref Range: 65 - 99 mg/dL 282 (H) 216 (H) 218 (H) 251 (H) 235 (H)   Review of Glycemic Control  Diabetes history: DM2 Outpatient Diabetes medications: Levemir 40 units QHS, Novolog 15-20 units TID Current orders for Inpatient glycemic control: Levemir 40 units QHS, Novolog 0-15 units TID with meals, Novolog 0-5 units QHS, Novolog 6 units TID with meals  Inpatient Diabetes Program Recommendations: Insulin - Basal: Please consider increasing Levemir to 43 units QHS. Insulin - Meal Coverage: Please consider increasing meal coverage to Novolog 10 units TID with meals.  Thanks, Barnie Alderman, RN, MSN, CDE Diabetes Coordinator Inpatient Diabetes Program [phone] (Team Pager from 8am to 5pm)

## 2018-03-04 NOTE — Care Management Note (Signed)
Case Management Note  Patient Details  Name: Caleb Riley MRN: [id number] Date of Birth: 05/24/1951  Subjective/Objective:    Hepatic encephalopathy. Chart reviewed Patient is from home, lives alone, ind, has daughters who assists when needed and with medications.      Action/Plan:  PT eval pending.   Expected Discharge Date:    03/05/2018              Expected Discharge Plan:     In-House Referral:     Discharge planning Services  CM Consult  Post Acute Care Choice:    Choice offered to:     DME Arranged:    DME Agency:     HH Arranged:    HH Agency:     Status of Service:  In process, will continue to follow  If discussed at Long Length of Stay Meetings, dates discussed:    Additional Comments:  Charmel Pronovost, Chauncey Reading, RN 03/04/2018, 11:36 AM

## 2018-03-04 NOTE — Progress Notes (Signed)
PROGRESS NOTE    Caleb Riley  BMW:[id number] DOB: [birth year] DOA: 03/03/2018 PCP: Vidal Schwalbe, MD    Brief Narrative:  67 year old male with history of hypertension and diabetes as well as liver cirrhosis, presents to the hospital with complaints of altered mental status.  Found to have hepatic encephalopathy and admitted for further treatments.  Clinically he is improving with IV fluids and lactulose therapy.  Ammonia remains elevated.  Continue current treatments.   Assessment & Plan:   Principal Problem:   Hepatic encephalopathy (HCC) Active Problems:   Lethargy   Essential hypertension, benign   Pancytopenia (HCC)   Cirrhosis of liver (HCC)   Mixed hyperlipidemia   Uncontrolled type 2 diabetes mellitus with hyperglycemia (HCC)   GERD (gastroesophageal reflux disease)   Anemia, chronic disease   Thrombocytopenia (HCC)   Hyperglycemia   1. Hepatic encephalopathy.  Possibly related to noncompliance with lactulose.  Patient has been restarted on lactulose and clinically he is better.  Ammonia remains elevated today.  Continue on lactulose dosing.  Recheck labs in a.m.  Clinically he is getting better.  Continue on Xifaxan. 2. Pancytopenia/thrombocytopenia/anemia.  Related to underlying chronic liver disease.  No signs of bleeding.  Continue to monitor. 3. Hypertension.  Blood pressure currently stable.  Continue at home medications. 4. Diabetes.  Blood sugars remain elevated.  Increasing Levemir and meal coverage NovoLog 5. 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  Continue on lactulose and Xifaxan for elevated ammonia.  LFTs appear to be stable.   DVT prophylaxis: SCDs Code Status: Full code Family Communication: No family present Disposition Plan: Discharge home once improved.  Physical therapy evaluation requested.   Consultants:     Procedures:     Antimicrobials:       Subjective: Feeling better today.  Says he has a couple of bowel movements yesterday and had  one this morning.  No vomiting.  Objective: Vitals:   03/04/18 0433 03/04/18 0502 03/04/18 0600 03/04/18 0800  BP:  (!) 130/50 130/65   Pulse:  (!) 47 67   Resp:  (!) 21 20   Temp: 98.1 F (36.7 C)   98.3 F (36.8 C)  TempSrc: Oral   Oral  SpO2:  100% 100%   Weight:      Height:        Intake/Output Summary (Last 24 hours) at 03/04/2018 1013 Last data filed at 03/04/2018 0500 Gross per 24 hour  Intake 980 ml  Output 1000 ml  Net -20 ml   Filed Weights   03/03/18 0953 03/03/18 1246  Weight: 97.5 kg (215 lb) 89.8 kg (197 lb 15.6 oz)    Examination:  General exam: Appears calm and comfortable  Respiratory system: Clear to auscultation. Respiratory effort normal. Cardiovascular system: S1 & S2 heard, RRR. No JVD, murmurs, rubs, gallops or clicks. No pedal edema. Gastrointestinal system: Abdomen is nondistended, soft and nontender. No organomegaly or masses felt. Normal bowel sounds heard. Central nervous system: Alert and oriented. No focal neurological deficits.  Mild asterixis in the left hand Extremities: Symmetric 5 x 5 power. Skin: No rashes, lesions or ulcers Psychiatry: Judgement and insight appear normal. Mood & affect appropriate.     Data Reviewed: I have personally reviewed following labs and imaging studies  CBC: Recent Labs  Lab 03/03/18 0948 03/03/18 1008 03/04/18 0454  WBC  --  2.4* 1.9*  NEUTROABS  --  1.6*  --   HGB 8.5* 8.7* 8.0*  HCT 25.0* 25.1* 23.3*  MCV  --  90.0 91.4  PLT  --  34* 36*   Basic Metabolic Panel: Recent Labs  Lab 03/03/18 0948 03/03/18 1008 03/04/18 0454  NA 143 139 140  K 4.3 4.3 3.8  CL 111 115* 117*  CO2  --  18* 19*  GLUCOSE 295* 292* 229*  BUN 25* 27* 22*  CREATININE 1.30* 1.23 1.21  CALCIUM  --  8.8* 8.4*   GFR: Estimated Creatinine Clearance: 67.2 mL/min (by C-G formula based on SCr of 1.21 mg/dL). Liver Function Tests: Recent Labs  Lab 03/03/18 1008 03/04/18 0454  AST 33 30  ALT 30 28  ALKPHOS 97 83   BILITOT 1.9* 1.6*  PROT 7.4 6.7  ALBUMIN 2.9* 2.5*   No results for input(s): LIPASE, AMYLASE in the last 168 hours. Recent Labs  Lab 03/03/18 1008 03/04/18 0454  AMMONIA 104* 131*   Coagulation Profile: Recent Labs  Lab 03/03/18 1008  INR 1.37   Cardiac Enzymes: Recent Labs  Lab 03/03/18 1008  TROPONINI <0.03   BNP (last 3 results) No results for input(s): PROBNP in the last 8760 hours. HbA1C: No results for input(s): HGBA1C in the last 72 hours. CBG: Recent Labs  Lab 03/03/18 1000 03/03/18 1623 03/03/18 1953 03/04/18 0242 03/04/18 0843  GLUCAP 282* 216* 218* 251* 235*   Lipid Profile: No results for input(s): CHOL, HDL, LDLCALC, TRIG, CHOLHDL, LDLDIRECT in the last 72 hours. Thyroid Function Tests: No results for input(s): TSH, T4TOTAL, FREET4, T3FREE, THYROIDAB in the last 72 hours. Anemia Panel: No results for input(s): VITAMINB12, FOLATE, FERRITIN, TIBC, IRON, RETICCTPCT in the last 72 hours. Sepsis Labs: No results for input(s): PROCALCITON, LATICACIDVEN in the last 168 hours.  Recent Results (from the past 240 hour(s))  MRSA PCR Screening     Status: None   Collection Time: 03/03/18  1:00 PM  Result Value Ref Range Status   MRSA by PCR NEGATIVE NEGATIVE Final    Comment:        The GeneXpert MRSA Assay (FDA approved for NASAL specimens only), is one component of a comprehensive MRSA colonization surveillance program. It is not intended to diagnose MRSA infection nor to guide or monitor treatment for MRSA infections. Performed at [facility], [address] [place], [place] [address]   Culture, blood (routine x 2)     Status: None (Preliminary result)   Collection Time: 03/03/18  1:25 PM  Result Value Ref Range Status   Specimen Description BLOOD LEFT ARM  Final   Special Requests   Final    BOTTLES DRAWN AEROBIC AND ANAEROBIC Blood Culture adequate volume   Culture   Final    NO GROWTH < 24 HOURS Performed at [facility], [address], [place], [place] [address]    Report Status PENDING  Incomplete  Culture, blood (routine x 2)     Status: None (Preliminary result)   Collection Time: 03/03/18  1:29 PM  Result Value Ref Range Status   Specimen Description BLOOD LEFT ARM  Final   Special Requests   Final    BOTTLES DRAWN AEROBIC AND ANAEROBIC Blood Culture adequate volume   Culture   Final    NO GROWTH < 24 HOURS Performed at [facility], [address] [place], [place] [address]    Report Status PENDING  Incomplete         Radiology Studies: Dg Chest Port 1 View  Result Date: 03/03/2018 CLINICAL DATA:  Confusion with fall. EXAM: PORTABLE CHEST 1 VIEW COMPARISON:  Feb 11, 2018 FINDINGS: No  edema or consolidation. Heart is mildly enlarged with pulmonary vascularity normal. No adenopathy. No bone lesions. IMPRESSION: Mild cardiac enlargement.  No edema or consolidation. Electronically Signed   By: Lowella Grip III M.D.   On: 03/03/2018 12:54   Ct Head Code Stroke Wo Contrast`  Result Date: 03/03/2018 CLINICAL DATA:  Code stroke. 67 year old male who woke with confusion, falls, dizziness, left side weakness. History of cirrhosis and hepatic encephalopathy. EXAM: CT HEAD WITHOUT CONTRAST TECHNIQUE: Contiguous axial images were obtained from the base of the skull through the vertex without intravenous contrast. COMPARISON:  11/14/2017 noncontrast head CT and earlier. FINDINGS: Brain: Stable cerebral volume. Stable gray-white matter differentiation throughout the brain. Mild-to-moderate bilateral patchy white matter hypodensity. No midline shift, ventriculomegaly, mass effect, evidence of mass lesion, intracranial hemorrhage or evidence of cortically based acute infarction. No cortical encephalomalacia identified. Vascular: Calcified atherosclerosis at the skull base. No suspicious intracranial vascular hyperdensity. Skull: No acute osseous abnormality identified. Sinuses/Orbits: Stable mild mucosal thickening. Tympanic  cavities and mastoids remain clear. Other: Visualized orbits and scalp soft tissues are within normal limits. ASPECTS [facility] Stroke Program Early CT Score) - Ganglionic level infarction (caudate, lentiform nuclei, internal capsule, insula, M1-M3 cortex): 7 - Supraganglionic infarction (M4-M6 cortex): 3 Total score (0-10 with 10 being normal): 10 IMPRESSION: 1. No acute cortically based infarct or intracranial hemorrhage identified. ASPECTS is 10. 2. Stable chronic patchy cerebral white matter hypodensity, most commonly due to chronic small vessel disease. 3. Study discussed by telephone with JULIE IDOL PA-C in the ED on 03/03/2018 at 10:05 . Electronically Signed   By: Genevie Ann M.D.   On: 03/03/2018 10:06        Scheduled Meds: . atorvastatin  10 mg Oral q1800  . insulin aspart  0-15 Units Subcutaneous TID WC  . insulin aspart  0-5 Units Subcutaneous QHS  . insulin aspart  6 Units Subcutaneous TID WC  . insulin detemir  40 Units Subcutaneous Q2200  . lactulose  30 g Oral QID  . pantoprazole  40 mg Oral Daily  . propranolol  20 mg Oral BID  . rifaximin  550 mg Oral BID  . spironolactone  25 mg Oral BID  . tamsulosin  0.4 mg Oral QPC supper   Continuous Infusions: . sodium chloride       LOS: 1 day    Time spent: 71mins Greater than 50% of this time spent in direct contact with patient discussing management of hepatic encephalopathy, importance of medication compliance and plans for further hospital course    Kathie Dike, MD [facility] Pager [phone]  If 7PM-7AM, please contact night-coverage [email or web address] Password [email or web address] 03/04/2018, 10:13 AM

## 2018-03-05 ENCOUNTER — Encounter: Payer: Self-pay | Admitting: "Endocrinology

## 2018-03-05 DIAGNOSIS — D638 Anemia in other chronic diseases classified elsewhere: Secondary | ICD-10-CM

## 2018-03-05 LAB — COMPREHENSIVE METABOLIC PANEL
ALK PHOS: 80 U/L (ref 38–126)
ALT: 35 U/L (ref 17–63)
AST: 37 U/L (ref 15–41)
Albumin: 2.4 g/dL — ABNORMAL LOW (ref 3.5–5.0)
BILIRUBIN TOTAL: 1.6 mg/dL — AB (ref 0.3–1.2)
BUN: 18 mg/dL (ref 6–20)
CO2: 20 mmol/L — AB (ref 22–32)
CREATININE: 1.06 mg/dL (ref 0.61–1.24)
Calcium: 8 mg/dL — ABNORMAL LOW (ref 8.9–10.3)
Chloride: 119 mmol/L — ABNORMAL HIGH (ref 101–111)
GFR calc non Af Amer: >60 mL/min (ref >60)
Glucose, Bld: 201 mg/dL — ABNORMAL HIGH (ref 65–99)
Potassium: 4 mmol/L (ref 3.5–5.1)
Sodium: 141 mmol/L (ref 135–145)
TOTAL PROTEIN: 6.5 g/dL (ref 6.5–8.1)

## 2018-03-05 LAB — GLUCOSE, CAPILLARY
GLUCOSE-CAPILLARY: 158 mg/dL — AB (ref 65–99)
GLUCOSE-CAPILLARY: 331 mg/dL — AB (ref 65–99)

## 2018-03-05 LAB — AMMONIA: AMMONIA: 64 umol/L — AB (ref 9–35)

## 2018-03-05 MED ORDER — RIFAXIMIN 550 MG PO TABS: 550.0000 mg | ORAL_TABLET | Freq: Two times a day (BID) | ORAL | 0 refills | Status: AC

## 2018-03-05 MED ORDER — LACTULOSE 10 GM/15ML PO SOLN: 30.0000 g | Freq: Three times a day (TID) | ORAL | 0 refills | Status: AC

## 2018-03-05 NOTE — Evaluation (Signed)
Physical Therapy Evaluation Patient Details Name: Caleb Riley MRN: [id number] DOB: 20-Jan-1951 Today's Date: 03/05/2018   History of Present Illness  Caleb Riley is a 67 y.o. 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 The patient woke up this morning around 4 am with increased confusion. Family states that patient was at baseline when he went to bed the previous night at 9 pm. Family also states that patient's normal baseline is mildly confused due to cirrhosis of the liver. Due to patient's confusion and reports of feeling weak EMS was called and patient was sent to ED with possible code stroke. Patient denies pain. He is a poor historian for his own health history.    Clinical Impression  Patient functioning near baseline for functional mobility and gait, other than demonstrating slightly slower than normal cadence without loss of balance.  Patient encouraged to ambulate ad lib in room and in hallways for length of stay.  Plan:  Patient discharged from physical therapy to care of nursing for ambulation daily as tolerated for length of stay.     Follow Up Recommendations No PT follow up    Equipment Recommendations  None recommended by PT    Recommendations for Other Services       Precautions / Restrictions Precautions Precautions: None Restrictions Weight Bearing Restrictions: No      Mobility  Bed Mobility Overal bed mobility: Independent                Transfers Overall transfer level: Independent                  Ambulation/Gait Ambulation/Gait assistance: Modified independent (Device/Increase time) Ambulation Distance (Feet): 150 Feet Assistive device: IV Pole;None Gait Pattern/deviations: Decreased step length - right;Decreased step length - left;Decreased stride length Gait velocity: decreased   General Gait Details: demonstrates slightly slower than normal cadence without  loss of balance without using an AD and when pushing IV pole  Stairs            Wheelchair Mobility    Modified Rankin (Stroke Patients Only)       Balance Overall balance assessment: No apparent balance deficits (not formally assessed)                                           Pertinent Vitals/Pain Pain Assessment: 0-10 Pain Score: 2  Pain Location: LLE Pain Descriptors / Indicators: Sore Pain Intervention(s): Limited activity within patient's tolerance;Monitored during session    Home Living Family/patient expects to be discharged to:: Private residence Living Arrangements: Children Available Help at Discharge: Family Type of Home: Mobile home Home Access: Ramped entrance     Home Layout: One level Home Equipment: Cane - single point;Walker - 2 wheels      Prior Function Level of Independence: Independent with assistive device(s)         Comments: community ambulator with RW or SPC PRN     Hand Dominance   Dominant Hand: Right    Extremity/Trunk Assessment   Upper Extremity Assessment Upper Extremity Assessment: Overall WFL for tasks assessed    Lower Extremity Assessment Lower Extremity Assessment: Overall WFL for tasks assessed    Cervical / Trunk Assessment Cervical / Trunk Assessment: Normal  Communication   Communication: Prefers language other than English  Cognition Arousal/Alertness: Awake/alert Behavior During Therapy: [facility]  for tasks assessed/performed Overall Cognitive Status: Within Functional Limits for tasks assessed                                        General Comments      Exercises     Assessment/Plan    PT Assessment Patent does not need any further PT services  PT Problem List         PT Treatment Interventions      PT Goals (Current goals can be found in the Care Plan section)  Acute Rehab PT Goals Patient Stated Goal: return home PT Goal Formulation: With patient Time  For Goal Achievement: 14-Mar-2018 Potential to Achieve Goals: Good    Frequency     Barriers to discharge        Co-evaluation               AM-PAC PT "6 Clicks" Daily Activity  Outcome Measure Difficulty turning over in bed (including adjusting bedclothes, sheets and blankets)?: None Difficulty moving from lying on back to sitting on the side of the bed? : None Difficulty sitting down on and standing up from a chair with arms (e.g., wheelchair, bedside commode, etc,.)?: None Help needed moving to and from a bed to chair (including a wheelchair)?: None Help needed walking in hospital room?: None Help needed climbing 3-5 steps with a railing? : None 6 Click Score: 24    End of Session   Activity Tolerance: Patient tolerated treatment well Patient left: in bed(seated at bedside) Nurse Communication: Mobility status PT Visit Diagnosis: Unsteadiness on feet (R26.81);Other abnormalities of gait and mobility (R26.89);Muscle weakness (generalized) (M62.81)    Time: 9450-3888 PT Time Calculation (min) (ACUTE ONLY): 17 min   Charges:   PT Evaluation $PT Eval Low Complexity: 1 Low PT Treatments $Gait Training: 8-22 mins   PT G Codes:        3:07 PM, 2018-03-14 Lonell Grandchild, MPT Physical Therapist with [facility] 336 [phone] office [phone] mobile phone

## 2018-03-05 NOTE — Progress Notes (Signed)
Discharge instructions gone over with patient and daughter, verbalized understanding. IV removed, patient tolerated procedure well. Printed prescription given to patient.

## 2018-03-05 NOTE — Discharge Summary (Signed)
Physician Discharge Summary  Caleb Riley IEP:[id number] DOB: Nov 10, 1950 DOA: 03/03/2018  PCP: Vidal Schwalbe, MD  Admit date: 03/03/2018 Discharge date: 03/05/2018  Admitted From: Home  Disposition: Home  Recommendations for Outpatient Follow-up:  1. Follow up with PCP in 1-2 weeks 2. Please obtain BMP/CBC in one week   Discharge Condition: Stable CODE STATUS: Full code Diet recommendation: Low-salt, low-carb  Brief/Interim Summary: 67 year old male with a history of alcoholic cirrhosis, presented to the hospital with complaints of altered mental status.  Found to have hepatic encephalopathy and was admitted for further treatments.  Discharge Diagnoses:  Principal Problem:   Hepatic encephalopathy ([place]) Active Problems:   Lethargy   Essential hypertension, benign   Pancytopenia (HCC)   Cirrhosis of liver (HCC)   Mixed hyperlipidemia   Uncontrolled type 2 diabetes mellitus with hyperglycemia (HCC)   GERD (gastroesophageal reflux disease)   Anemia, chronic disease   Thrombocytopenia (HCC)   Hyperglycemia  1. Hepatic encephalopathy.  Possibly related to noncompliance with lactulose.  Patient was restarted on lactulose and clinically he is better.  Although ammonia still mildly elevated, clinically he appears to be back to baseline.  Continue on lactulose dosing. Continue on Xifaxan. 2. Pancytopenia/thrombocytopenia/anemia.  Related to underlying chronic liver disease.  No signs of bleeding.    Appears to be at baseline. 3. Hypertension.  Blood pressure currently stable.  Continue at home medications. 4. Diabetes.    Continue on home dose of Levemir and NovoLog after discharge. 5. 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  Continue on lactulose and Xifaxan for elevated ammonia.  LFTs appear to be stable.  Discharge Instructions  Discharge Instructions    Diet - low sodium heart healthy   Complete by:  As directed    Increase activity slowly   Complete by:  As directed      Allergies  as of 03/05/2018   No Known Allergies     Medication List    TAKE these medications   atorvastatin 10 MG tablet Commonly known as:  LIPITOR Take 1 tablet by mouth daily.   FREESTYLE LIBRE SENSOR SYSTEM Misc Use one sensor every 10 days.   lactulose 10 GM/15ML solution Commonly known as:  CHRONULAC Take 45 mLs (30 g total) by mouth 3 (three) times daily. Titrate for 2-3 soft bowel movements daily What changed:  additional instructions   LEVEMIR FLEXTOUCH 100 UNIT/ML Pen Generic drug:  Insulin Detemir INJECT 40 UNITS S.Q. ONCE DAILY AT 10 P.M.   NOVOLOG FLEXPEN 100 UNIT/ML FlexPen Generic drug:  insulin aspart INJECT 15 TO 20 UNITS UNDER THE SKIN THREE TIMES DAILY WITH MEALS.   omeprazole 20 MG capsule Commonly known as:  PRILOSEC Take 20 mg by mouth daily.   propranolol 20 MG tablet Commonly known as:  INDERAL Take 1 tablet (20 mg total) by mouth 2 (two) times daily.   rifaximin 550 MG Tabs tablet Commonly known as:  XIFAXAN Take 1 tablet (550 mg total) by mouth 2 (two) times daily.   spironolactone 25 MG tablet Commonly known as:  ALDACTONE Take 25 mg by mouth 2 (two) times daily.   tamsulosin 0.4 MG Caps capsule Commonly known as:  FLOMAX Take 0.4 mg by mouth daily.   traMADol 50 MG tablet Commonly known as:  ULTRAM Take 1-2 tablets by mouth 3 (three) times daily as needed.   zinc sulfate 220 (50 Zn) MG capsule Take 220 mg by mouth daily.       No Known Allergies  Consultations:     Procedures/Studies:  Dg Chest Port 1 View  Result Date: 03/03/2018 CLINICAL DATA:  Confusion with fall. EXAM: PORTABLE CHEST 1 VIEW COMPARISON:  Feb 11, 2018 FINDINGS: No edema or consolidation. Heart is mildly enlarged with pulmonary vascularity normal. No adenopathy. No bone lesions. IMPRESSION: Mild cardiac enlargement.  No edema or consolidation. Electronically Signed   By: Lowella Grip III M.D.   On: 03/03/2018 12:54   Ct Head Code Stroke Wo Contrast`  Result  Date: 03/03/2018 CLINICAL DATA:  Code stroke. 67 year old male who woke with confusion, falls, dizziness, left side weakness. History of cirrhosis and hepatic encephalopathy. EXAM: CT HEAD WITHOUT CONTRAST TECHNIQUE: Contiguous axial images were obtained from the base of the skull through the vertex without intravenous contrast. COMPARISON:  11/14/2017 noncontrast head CT and earlier. FINDINGS: Brain: Stable cerebral volume. Stable gray-white matter differentiation throughout the brain. Mild-to-moderate bilateral patchy white matter hypodensity. No midline shift, ventriculomegaly, mass effect, evidence of mass lesion, intracranial hemorrhage or evidence of cortically based acute infarction. No cortical encephalomalacia identified. Vascular: Calcified atherosclerosis at the skull base. No suspicious intracranial vascular hyperdensity. Skull: No acute osseous abnormality identified. Sinuses/Orbits: Stable mild mucosal thickening. Tympanic cavities and mastoids remain clear. Other: Visualized orbits and scalp soft tissues are within normal limits. ASPECTS [facility] Stroke Program Early CT Score) - Ganglionic level infarction (caudate, lentiform nuclei, internal capsule, insula, M1-M3 cortex): 7 - Supraganglionic infarction (M4-M6 cortex): 3 Total score (0-10 with 10 being normal): 10 IMPRESSION: 1. No acute cortically based infarct or intracranial hemorrhage identified. ASPECTS is 10. 2. Stable chronic patchy cerebral white matter hypodensity, most commonly due to chronic small vessel disease. 3. Study discussed by telephone with JULIE IDOL PA-C in the ED on 03/03/2018 at 10:05 . Electronically Signed   By: Genevie Ann M.D.   On: 03/03/2018 10:06       Subjective: Feeling better today.  No lethargy or confusion.  Able to ambulate without difficulty.  Discharge Exam: Vitals:   03/04/18 2148 03/05/18 0600  BP: (!) 120/54 126/61  Pulse: 60 (!) 110  Resp: 18 18  Temp: 98.8 F (37.1 C) 98.1 F (36.7 C)  SpO2: 99%  99%   Vitals:   03/04/18 1228 03/04/18 1506 03/04/18 2148 03/05/18 0600  BP:  (!) 128/55 (!) 120/54 126/61  Pulse:  (!) 59 60 (!) 110  Resp:  20 18 18   Temp: 98.4 F (36.9 C) 98.2 F (36.8 C) 98.8 F (37.1 C) 98.1 F (36.7 C)  TempSrc: Oral Oral Oral Oral  SpO2:  100% 99% 99%  Weight:      Height:        General: Pt is alert, awake, not in acute distress Cardiovascular: RRR, S1/S2 +, no rubs, no gallops Respiratory: CTA bilaterally, no wheezing, no rhonchi Abdominal: Soft, NT, ND, bowel sounds + Extremities: no edema, no cyanosis    The results of significant diagnostics from this hospitalization (including imaging, microbiology, ancillary and laboratory) are listed below for reference.     Microbiology: Recent Results (from the past 240 hour(s))  MRSA PCR Screening     Status: None   Collection Time: 03/03/18  1:00 PM  Result Value Ref Range Status   MRSA by PCR NEGATIVE NEGATIVE Final    Comment:        The GeneXpert MRSA Assay (FDA approved for NASAL specimens only), is one component of a comprehensive MRSA colonization surveillance program. It is not intended to diagnose MRSA infection nor to guide or monitor treatment for MRSA infections. Performed  at [facility], [address] [place], [place] [address]   Culture, blood (routine x 2)     Status: None (Preliminary result)   Collection Time: 03/03/18  1:25 PM  Result Value Ref Range Status   Specimen Description BLOOD LEFT ARM  Final   Special Requests   Final    BOTTLES DRAWN AEROBIC AND ANAEROBIC Blood Culture adequate volume   Culture   Final    NO GROWTH 2 DAYS Performed at [facility], [address], [place], [place] [address]    Report Status PENDING  Incomplete  Culture, blood (routine x 2)     Status: None (Preliminary result)   Collection Time: 03/03/18  1:29 PM  Result Value Ref Range Status   Specimen Description BLOOD LEFT ARM  Final   Special Requests   Final    BOTTLES DRAWN AEROBIC  AND ANAEROBIC Blood Culture adequate volume   Culture   Final    NO GROWTH 2 DAYS Performed at [facility], [address], [place], [place] [address]    Report Status PENDING  Incomplete     Labs: BNP (last 3 results) No results for input(s): BNP in the last 8760 hours. Basic Metabolic Panel: Recent Labs  Lab 03/03/18 0948 03/03/18 1008 03/04/18 0454 03/05/18 0447  NA 143 139 140 141  K 4.3 4.3 3.8 4.0  CL 111 115* 117* 119*  CO2  --  18* 19* 20*  GLUCOSE 295* 292* 229* 201*  BUN 25* 27* 22* 18  CREATININE 1.30* 1.23 1.21 1.06  CALCIUM  --  8.8* 8.4* 8.0*   Liver Function Tests: Recent Labs  Lab 03/03/18 1008 03/04/18 0454 03/05/18 0447  AST 33 30 37  ALT 30 28 35  ALKPHOS 97 83 80  BILITOT 1.9* 1.6* 1.6*  PROT 7.4 6.7 6.5  ALBUMIN 2.9* 2.5* 2.4*   No results for input(s): LIPASE, AMYLASE in the last 168 hours. Recent Labs  Lab 03/03/18 1008 03/04/18 0454 03/05/18 0447  AMMONIA 104* 131* 64*   CBC: Recent Labs  Lab 03/03/18 0948 03/03/18 1008 03/04/18 0454  WBC  --  2.4* 1.9*  NEUTROABS  --  1.6*  --   HGB 8.5* 8.7* 8.0*  HCT 25.0* 25.1* 23.3*  MCV  --  90.0 91.4  PLT  --  34* 36*   Cardiac Enzymes: Recent Labs  Lab 03/03/18 1008  TROPONINI <0.03   BNP: Invalid input(s): POCBNP CBG: Recent Labs  Lab 03/04/18 1140 03/04/18 1652 03/04/18 2145 03/05/18 0756 03/05/18 1134  GLUCAP 224* 102* 201* 158* 331*   D-Dimer No results for input(s): DDIMER in the last 72 hours. Hgb A1c No results for input(s): HGBA1C in the last 72 hours. Lipid Profile No results for input(s): CHOL, HDL, LDLCALC, TRIG, CHOLHDL, LDLDIRECT in the last 72 hours. Thyroid function studies No results for input(s): TSH, T4TOTAL, T3FREE, THYROIDAB in the last 72 hours.  Invalid input(s): FREET3 Anemia work up No results for input(s): VITAMINB12, FOLATE, FERRITIN, TIBC, IRON, RETICCTPCT in the last 72 hours. Urinalysis    Component Value Date/Time   COLORURINE  YELLOW 03/03/2018 [place] 03/03/2018 1207   LABSPEC 1.013 03/03/2018 1207   PHURINE 6.0 03/03/2018 1207   GLUCOSEU 50 (A) 03/03/2018 1207   HGBUR NEGATIVE 03/03/2018 [place] 03/03/2018 1207   [place] 03/03/2018 1207   PROTEINUR NEGATIVE 03/03/2018 1207   UROBILINOGEN 0.2 01/31/2014 2222   NITRITE NEGATIVE 03/03/2018 1207   LEUKOCYTESUR NEGATIVE  03/03/2018 1207   Sepsis Labs Invalid input(s): PROCALCITONIN,  WBC,  LACTICIDVEN Microbiology Recent Results (from the past 240 hour(s))  MRSA PCR Screening     Status: None   Collection Time: 03/03/18  1:00 PM  Result Value Ref Range Status   MRSA by PCR NEGATIVE NEGATIVE Final    Comment:        The GeneXpert MRSA Assay (FDA approved for NASAL specimens only), is one component of a comprehensive MRSA colonization surveillance program. It is not intended to diagnose MRSA infection nor to guide or monitor treatment for MRSA infections. Performed at [facility], [address], [place], [place] [address]   Culture, blood (routine x 2)     Status: None (Preliminary result)   Collection Time: 03/03/18  1:25 PM  Result Value Ref Range Status   Specimen Description BLOOD LEFT ARM  Final   Special Requests   Final    BOTTLES DRAWN AEROBIC AND ANAEROBIC Blood Culture adequate volume   Culture   Final    NO GROWTH 2 DAYS Performed at [facility], [address] [place], [place] [address]    Report Status PENDING  Incomplete  Culture, blood (routine x 2)     Status: None (Preliminary result)   Collection Time: 03/03/18  1:29 PM  Result Value Ref Range Status   Specimen Description BLOOD LEFT ARM  Final   Special Requests   Final    BOTTLES DRAWN AEROBIC AND ANAEROBIC Blood Culture adequate volume   Culture   Final    NO GROWTH 2 DAYS Performed at [facility], [address] [place], [place] [address]    Report Status PENDING  Incomplete     Time coordinating discharge:  40mins  SIGNED:   Kathie Dike, MD  [facility] 03/05/2018, 2:23 PM Pager   If 7PM-7AM, please contact night-coverage [email or web address] Password [email or web address]

## 2018-03-08 LAB — CULTURE, BLOOD (ROUTINE X 2)
CULTURE: NO GROWTH
CULTURE: NO GROWTH
SPECIAL REQUESTS: ADEQUATE
Special Requests: ADEQUATE

## 2018-03-25 DEATH — deceased

## 2018-06-20 ENCOUNTER — Encounter (HOSPITAL_COMMUNITY): Payer: Self-pay
